# Patient Record
Sex: Male | Born: 1935 | Race: White | Hispanic: No | Smoking: Never smoker
Health system: Southern US, Community
[De-identification: ages and names within clinical notes are randomized; demographics above are authoritative.]

## PROBLEM LIST (undated history)

## (undated) DIAGNOSIS — R0689 Other abnormalities of breathing: Secondary | ICD-10-CM

## (undated) DIAGNOSIS — K219 Gastro-esophageal reflux disease without esophagitis: Secondary | ICD-10-CM

## (undated) DIAGNOSIS — R319 Hematuria, unspecified: Secondary | ICD-10-CM

## (undated) DIAGNOSIS — I10 Essential (primary) hypertension: Secondary | ICD-10-CM

## (undated) DIAGNOSIS — E871 Hypo-osmolality and hyponatremia: Secondary | ICD-10-CM

## (undated) DIAGNOSIS — E875 Hyperkalemia: Secondary | ICD-10-CM

## (undated) DIAGNOSIS — J9601 Acute respiratory failure with hypoxia: Secondary | ICD-10-CM

## (undated) DIAGNOSIS — J441 Chronic obstructive pulmonary disease with (acute) exacerbation: Secondary | ICD-10-CM

## (undated) DIAGNOSIS — Z950 Presence of cardiac pacemaker: Secondary | ICD-10-CM

## (undated) HISTORY — DX: Other abnormalities of breathing: R06.89

## (undated) HISTORY — DX: Hematuria, unspecified: R31.9

## (undated) HISTORY — DX: Chronic obstructive pulmonary disease with (acute) exacerbation: J44.1

## (undated) HISTORY — PX: PACEMAKER INSERTION: SHX728

## (undated) HISTORY — DX: Acute respiratory failure with hypoxia: J96.01

## (undated) HISTORY — DX: Essential (primary) hypertension: I10

## (undated) HISTORY — DX: Hyperkalemia: E87.5

## (undated) HISTORY — DX: Hypo-osmolality and hyponatremia: E87.1

---

## 2010-05-30 DIAGNOSIS — Z950 Presence of cardiac pacemaker: Secondary | ICD-10-CM | POA: Insufficient documentation

## 2010-05-30 DIAGNOSIS — I495 Sick sinus syndrome: Secondary | ICD-10-CM | POA: Insufficient documentation

## 2011-02-18 DIAGNOSIS — K219 Gastro-esophageal reflux disease without esophagitis: Secondary | ICD-10-CM | POA: Insufficient documentation

## 2011-03-27 DIAGNOSIS — G629 Polyneuropathy, unspecified: Secondary | ICD-10-CM | POA: Insufficient documentation

## 2011-09-25 DIAGNOSIS — I482 Chronic atrial fibrillation, unspecified: Secondary | ICD-10-CM | POA: Insufficient documentation

## 2012-05-04 DIAGNOSIS — J40 Bronchitis, not specified as acute or chronic: Secondary | ICD-10-CM | POA: Insufficient documentation

## 2012-06-16 DIAGNOSIS — D689 Coagulation defect, unspecified: Secondary | ICD-10-CM | POA: Insufficient documentation

## 2012-06-16 DIAGNOSIS — Z7901 Long term (current) use of anticoagulants: Secondary | ICD-10-CM | POA: Insufficient documentation

## 2012-07-20 DIAGNOSIS — R269 Unspecified abnormalities of gait and mobility: Secondary | ICD-10-CM | POA: Insufficient documentation

## 2012-07-20 DIAGNOSIS — R296 Repeated falls: Secondary | ICD-10-CM | POA: Insufficient documentation

## 2012-10-07 ENCOUNTER — Emergency Department (HOSPITAL_COMMUNITY): Payer: Medicare Other

## 2012-10-07 ENCOUNTER — Encounter (HOSPITAL_COMMUNITY): Payer: Self-pay | Admitting: Nurse Practitioner

## 2012-10-07 ENCOUNTER — Inpatient Hospital Stay (HOSPITAL_COMMUNITY)
Admission: EM | Admit: 2012-10-07 | Discharge: 2012-10-12 | DRG: 189 | Disposition: A | Discharge status: Home / Self Care | Payer: Medicare Other | Attending: Internal Medicine | Admitting: Internal Medicine

## 2012-10-07 DIAGNOSIS — J9601 Acute respiratory failure with hypoxia: Secondary | ICD-10-CM | POA: Diagnosis present

## 2012-10-07 DIAGNOSIS — Z7901 Long term (current) use of anticoagulants: Secondary | ICD-10-CM

## 2012-10-07 DIAGNOSIS — J441 Chronic obstructive pulmonary disease with (acute) exacerbation: Secondary | ICD-10-CM | POA: Diagnosis present

## 2012-10-07 DIAGNOSIS — K219 Gastro-esophageal reflux disease without esophagitis: Secondary | ICD-10-CM | POA: Diagnosis present

## 2012-10-07 DIAGNOSIS — I5022 Chronic systolic (congestive) heart failure: Secondary | ICD-10-CM | POA: Diagnosis present

## 2012-10-07 DIAGNOSIS — IMO0002 Reserved for concepts with insufficient information to code with codable children: Secondary | ICD-10-CM

## 2012-10-07 DIAGNOSIS — Z23 Encounter for immunization: Secondary | ICD-10-CM

## 2012-10-07 DIAGNOSIS — E875 Hyperkalemia: Secondary | ICD-10-CM | POA: Diagnosis present

## 2012-10-07 DIAGNOSIS — R319 Hematuria, unspecified: Secondary | ICD-10-CM | POA: Diagnosis present

## 2012-10-07 DIAGNOSIS — Z79899 Other long term (current) drug therapy: Secondary | ICD-10-CM

## 2012-10-07 DIAGNOSIS — M6530 Trigger finger, unspecified finger: Secondary | ICD-10-CM | POA: Diagnosis present

## 2012-10-07 DIAGNOSIS — Z8701 Personal history of pneumonia (recurrent): Secondary | ICD-10-CM

## 2012-10-07 DIAGNOSIS — I509 Heart failure, unspecified: Secondary | ICD-10-CM | POA: Diagnosis present

## 2012-10-07 DIAGNOSIS — R0902 Hypoxemia: Secondary | ICD-10-CM

## 2012-10-07 DIAGNOSIS — E871 Hypo-osmolality and hyponatremia: Secondary | ICD-10-CM | POA: Diagnosis present

## 2012-10-07 DIAGNOSIS — J9600 Acute respiratory failure, unspecified whether with hypoxia or hypercapnia: Principal | ICD-10-CM | POA: Diagnosis present

## 2012-10-07 DIAGNOSIS — I10 Essential (primary) hypertension: Secondary | ICD-10-CM | POA: Diagnosis present

## 2012-10-07 DIAGNOSIS — I4891 Unspecified atrial fibrillation: Secondary | ICD-10-CM | POA: Diagnosis present

## 2012-10-07 DIAGNOSIS — G9340 Encephalopathy, unspecified: Secondary | ICD-10-CM | POA: Diagnosis present

## 2012-10-07 DIAGNOSIS — Z950 Presence of cardiac pacemaker: Secondary | ICD-10-CM | POA: Diagnosis present

## 2012-10-07 DIAGNOSIS — R0689 Other abnormalities of breathing: Secondary | ICD-10-CM

## 2012-10-07 HISTORY — DX: Gastro-esophageal reflux disease without esophagitis: K21.9

## 2012-10-07 HISTORY — DX: Presence of cardiac pacemaker: Z95.0

## 2012-10-07 HISTORY — DX: Chronic obstructive pulmonary disease with (acute) exacerbation: J44.1

## 2012-10-07 HISTORY — DX: Acute respiratory failure with hypoxia: J96.01

## 2012-10-07 HISTORY — DX: Essential (primary) hypertension: I10

## 2012-10-07 LAB — CBC WITH DIFFERENTIAL/PLATELET
Basophils Absolute: 0.1 10*3/uL (ref 0.0–0.1)
Eosinophils Absolute: 0.3 10*3/uL (ref 0.0–0.7)
Eosinophils Relative: 3 % (ref 0–5)
Lymphocytes Relative: 16 % (ref 12–46)
MCH: 29.4 pg (ref 26.0–34.0)
MCHC: 31.9 g/dL (ref 30.0–36.0)
MCV: 92.2 fL (ref 78.0–100.0)
Neutrophils Relative %: 67 % (ref 43–77)
Platelets: 244 10*3/uL (ref 150–400)
RBC: 3.6 MIL/uL — ABNORMAL LOW (ref 4.22–5.81)
RDW: 16.6 % — ABNORMAL HIGH (ref 11.5–15.5)
WBC: 11.1 10*3/uL — ABNORMAL HIGH (ref 4.0–10.5)

## 2012-10-07 LAB — POCT I-STAT 3, ART BLOOD GAS (G3+)
Acid-Base Excess: 4 mmol/L — ABNORMAL HIGH (ref 0.0–2.0)
O2 Saturation: 95 %
Patient temperature: 98.6
pCO2 arterial: 78.9 mmHg (ref 35.0–45.0)

## 2012-10-07 LAB — URINE MICROSCOPIC-ADD ON

## 2012-10-07 LAB — URINALYSIS, ROUTINE W REFLEX MICROSCOPIC
Nitrite: NEGATIVE
Protein, ur: 30 mg/dL — AB
Specific Gravity, Urine: 1.018 (ref 1.005–1.030)
Urobilinogen, UA: 0.2 mg/dL (ref 0.0–1.0)

## 2012-10-07 LAB — BASIC METABOLIC PANEL
CO2: 26 mEq/L (ref 19–32)
Calcium: 8.9 mg/dL (ref 8.4–10.5)
GFR calc non Af Amer: 73 mL/min — ABNORMAL LOW (ref >90)
Potassium: 5.2 mEq/L — ABNORMAL HIGH (ref 3.5–5.1)
Sodium: 133 mEq/L — ABNORMAL LOW (ref 135–145)

## 2012-10-07 LAB — POCT I-STAT TROPONIN I: Troponin i, poc: 0.04 ng/mL (ref 0.00–0.08)

## 2012-10-07 MED ORDER — LEVOFLOXACIN 750 MG PO TABS
750.0000 mg | ORAL_TABLET | Freq: Every day | ORAL | Status: DC
  Administered 2012-10-08: 750 mg via ORAL
  Filled 2012-10-07 (×2): qty 1

## 2012-10-07 MED ORDER — SODIUM CHLORIDE 0.9 % IJ SOLN
3.0000 mL | Freq: Two times a day (BID) | INTRAMUSCULAR | Status: DC
  Administered 2012-10-08 – 2012-10-11 (×5): 3 mL via INTRAVENOUS

## 2012-10-07 MED ORDER — ALBUTEROL SULFATE (5 MG/ML) 0.5% IN NEBU
5.0000 mg | INHALATION_SOLUTION | Freq: Once | RESPIRATORY_TRACT | Status: AC
  Administered 2012-10-07: 5 mg via RESPIRATORY_TRACT
  Filled 2012-10-07: qty 1

## 2012-10-07 MED ORDER — DABIGATRAN ETEXILATE MESYLATE 150 MG PO CAPS
150.0000 mg | ORAL_CAPSULE | Freq: Two times a day (BID) | ORAL | Status: DC
  Administered 2012-10-08 – 2012-10-12 (×10): 150 mg via ORAL
  Filled 2012-10-07 (×11): qty 1

## 2012-10-07 MED ORDER — FUROSEMIDE 20 MG PO TABS
20.0000 mg | ORAL_TABLET | Freq: Every day | ORAL | Status: DC
  Filled 2012-10-07: qty 1

## 2012-10-07 MED ORDER — ALBUTEROL SULFATE (5 MG/ML) 0.5% IN NEBU: 2.5000 mg | INHALATION_SOLUTION | RESPIRATORY_TRACT | Status: DC | PRN

## 2012-10-07 MED ORDER — METOPROLOL TARTRATE 25 MG PO TABS: 25.0000 mg | ORAL_TABLET | Freq: Two times a day (BID) | ORAL | Status: DC

## 2012-10-07 MED ORDER — LEVOTHYROXINE SODIUM 200 MCG PO TABS
200.0000 ug | ORAL_TABLET | Freq: Every day | ORAL | Status: DC
  Administered 2012-10-08 – 2012-10-12 (×5): 200 ug via ORAL
  Filled 2012-10-07 (×6): qty 1

## 2012-10-07 MED ORDER — PANTOPRAZOLE SODIUM 20 MG PO TBEC
20.0000 mg | DELAYED_RELEASE_TABLET | Freq: Every day | ORAL | Status: DC
  Administered 2012-10-08 – 2012-10-12 (×5): 20 mg via ORAL
  Filled 2012-10-07 (×5): qty 1

## 2012-10-07 MED ORDER — PREDNISONE 1 MG PO TABS
1.0000 mg | ORAL_TABLET | Freq: Every day | ORAL | Status: DC
  Administered 2012-10-08 – 2012-10-09 (×2): 1 mg via ORAL
  Filled 2012-10-07 (×3): qty 1

## 2012-10-07 MED ORDER — SIMVASTATIN 10 MG PO TABS
10.0000 mg | ORAL_TABLET | Freq: Every day | ORAL | Status: DC
  Filled 2012-10-07 (×2): qty 1

## 2012-10-07 MED ORDER — IPRATROPIUM BROMIDE 0.02 % IN SOLN
0.5000 mg | Freq: Once | RESPIRATORY_TRACT | Status: AC
  Administered 2012-10-07: 0.5 mg via RESPIRATORY_TRACT
  Filled 2012-10-07: qty 2.5

## 2012-10-07 MED ORDER — FUROSEMIDE 10 MG/ML IJ SOLN
40.0000 mg | Freq: Once | INTRAMUSCULAR | Status: AC
  Administered 2012-10-07: 40 mg via INTRAVENOUS
  Filled 2012-10-07: qty 4

## 2012-10-07 MED ORDER — METOPROLOL SUCCINATE ER 25 MG PO TB24
25.0000 mg | ORAL_TABLET | Freq: Two times a day (BID) | ORAL | Status: DC
  Administered 2012-10-08: 25 mg via ORAL
  Filled 2012-10-07 (×3): qty 1

## 2012-10-07 MED ORDER — ROPINIROLE HCL 0.5 MG PO TABS
0.5000 mg | ORAL_TABLET | Freq: Every day | ORAL | Status: DC
  Administered 2012-10-08: 0.5 mg via ORAL
  Filled 2012-10-07 (×3): qty 1

## 2012-10-07 MED ORDER — ALBUTEROL SULFATE (5 MG/ML) 0.5% IN NEBU
2.5000 mg | INHALATION_SOLUTION | Freq: Four times a day (QID) | RESPIRATORY_TRACT | Status: DC
  Administered 2012-10-08 (×2): 2.5 mg via RESPIRATORY_TRACT
  Filled 2012-10-07 (×3): qty 0.5

## 2012-10-07 NOTE — ED Provider Notes (Signed)
CSN: [id number]     Arrival date & time 10/07/12  1550 History   First MD Initiated Contact with Patient 10/07/12 1550     Chief Complaint  Patient presents with  . hypoxia    (Consider location/radiation/quality/duration/timing/severity/associated sxs/prior Treatment) HPI Comments: Patient is a 79 year old man with history of recent pneumonia, pacemaker presents today sent in from his primary care physician for hypoxia. He was at [facility] physicians for his 3 day checkup on a right lower lobe pneumonia. He was treated with Levaquin. He lives at home with his wife. His wife stopped the Levaquin because she felt like his mental status was worsening and he began slurring his speech. Later in the visit the wife noted it was not slurred speech and it was more excess mucous in the patient's mouth. Reports altered mental status is only present while sleeping. The wife reports chills, but no fever. This information was all collected from the physician note at [facility] physicians. No family members are in the room at this time. Patient reports that he "just doesn't feel well" he is oriented to person. He believes he is at [facility], but he knows he is in [place]. He gives the month of September.  The history is provided by the patient and medical records. No language interpreter was used.    Past Medical History  Diagnosis Date  . GERD (gastroesophageal reflux disease)   . Pacemaker medtronic single chamber   Past Surgical History  Procedure Laterality Date  . Pacemaker insertion     No family history on file. History  Substance Use Topics  . Smoking status: Never Smoker   . Smokeless tobacco: Not on file  . Alcohol Use: No    Review of Systems  Constitutional: Positive for chills. Negative for fever.  Respiratory: Positive for cough and shortness of breath.   Cardiovascular: Negative for chest pain.  Gastrointestinal: Negative for nausea, vomiting and abdominal pain.  All other  systems reviewed and are negative.    Allergies  Review of patient's allergies indicates not on file.  Home Medications   Current Outpatient Rx  Name  Route  Sig  Dispense  Refill  . dabigatran (PRADAXA) 150 MG CAPS capsule   Oral   Take 150 mg by mouth 2 (two) times daily.         . furosemide (LASIX) 20 MG tablet   Oral   Take 20 mg by mouth daily.         . lansoprazole (PREVACID) 30 MG capsule   Oral   Take 30 mg by mouth daily.         Marland Kitchen levothyroxine (SYNTHROID, LEVOTHROID) 200 MCG tablet   Oral   Take 200 mcg by mouth daily before breakfast.         . metoprolol succinate (TOPROL-XL) 25 MG 24 hr tablet   Oral   Take 25 mg by mouth 2 (two) times daily.         . predniSONE (DELTASONE) 1 MG tablet   Oral   Take 1 mg by mouth daily.         Marland Kitchen rOPINIRole (REQUIP) 0.5 MG tablet   Oral   Take 0.5 mg by mouth at bedtime.          BP 154/72  Pulse 46  Temp(Src) 97.7 F (36.5 C) (Oral)  Resp 18  SpO2 94% Physical Exam  Nursing note and vitals reviewed. Constitutional: He is oriented to person, place, and time. He  appears well-developed and well-nourished. No distress.  HENT:  Head: Normocephalic and atraumatic.  Right Ear: External ear normal.  Left Ear: External ear normal.  Nose: Nose normal.  Eyes: Conjunctivae are normal.  Neck: Normal range of motion. No tracheal deviation present.  Cardiovascular: Normal rate, regular rhythm and normal heart sounds.   Pulmonary/Chest: No stridor. Tachypnea noted. He has decreased breath sounds in the right lower field and the left lower field. He has wheezes in the right upper field, the right middle field, the left upper field and the left middle field.  Junky, dry cough heard during exam  Abdominal: Soft. He exhibits no distension. There is no tenderness.  Musculoskeletal: Normal range of motion.  Neurological: He is alert and oriented to person, place, and time. He has normal strength.  Follows all  commands. No focal deficits  Skin: Skin is warm and dry. He is not diaphoretic.  Psychiatric: He has a normal mood and affect. His behavior is normal.    ED Course  Procedures (including critical care time) Labs Review Labs Reviewed  MRSA PCR SCREENING - Abnormal; Notable for the following:    MRSA by PCR POSITIVE (*)    All other components within normal limits  CBC WITH DIFFERENTIAL - Abnormal; Notable for the following:    WBC 11.1 (*)    RBC 3.60 (*)    Hemoglobin 10.6 (*)    HCT 33.2 (*)    RDW 16.6 (*)    Monocytes Relative 14 (*)    Monocytes Absolute 1.6 (*)    All other components within normal limits  BASIC METABOLIC PANEL - Abnormal; Notable for the following:    Sodium 133 (*)    Potassium 5.2 (*)    GFR calc non Af Amer 73 (*)    GFR calc Af Amer 84 (*)    All other components within normal limits  URINALYSIS, ROUTINE W REFLEX MICROSCOPIC - Abnormal; Notable for the following:    Color, Urine RED (*)    APPearance CLOUDY (*)    Hgb urine dipstick LARGE (*)    Protein, ur 30 (*)    Leukocytes, UA SMALL (*)    All other components within normal limits  POCT I-STAT 3, BLOOD GAS (G3+) - Abnormal; Notable for the following:    pH, Arterial 7.235 (*)    pCO2 arterial 78.9 (*)    Bicarbonate 33.4 (*)    Acid-Base Excess 4.0 (*)    All other components within normal limits  CULTURE, BLOOD (ROUTINE X 2)  URINE CULTURE  URINE MICROSCOPIC-ADD ON  TROPONIN I  TROPONIN I  TROPONIN I  POCT I-STAT TROPONIN I  ABO/RH  TYPE AND SCREEN   Imaging Review Dg Chest 2 View  10/07/2012   CLINICAL DATA:  Shortness of breath  EXAM: CHEST  2 VIEW  COMPARISON:  10/04/2012  FINDINGS: Cardiac shadow remains enlarged. A pacing device is again seen. Mild vascular congestion is again noted. No focal confluent infiltrate is seen. Small effusions are noted. Findings of prior vertebral augmentation are seen. Chronic thoracic compression deformities are noted.  IMPRESSION: Mild vascular  congestion stable from the previous exam. No acute abnormality is noted.   Electronically Signed   By: Alcide Clever   On: 10/07/2012 16:59   Ct Head Wo Contrast  10/07/2012   *RADIOLOGY REPORT*  Clinical Data: Severe headache, altered mental status  CT HEAD WITHOUT CONTRAST  Technique:  Contiguous axial images were obtained from the base of the  skull through the vertex without contrast.  Comparison: None.  Findings: No acute intracranial hemorrhage, acute infarction, mass lesion, mass effect, midline shift or hydrocephalus.  Gray-white differentiation is preserved throughout.  Global cerebral and cerebellar atrophy with compensatory ex vacuo dilatation of the lateral ventricles.  Confluent periventricular and scattered deep and subcortical white matter hypoattenuation most consistent with the sequela of longstanding microvascular ischemia. Atherosclerotic calcifications noted in the bilateral cavernous carotid arteries.  No acute soft tissue or calvarial abnormality. Globes and orbits are intact and unremarkable bilaterally.  Normal aeration of the mastoid air cells and paranasal sinuses.  IMPRESSION:  1.  No acute intracranial abnormality. 2.  Atrophy and advanced microvascular ischemic white matter disease.   Original Report Authenticated By: Malachy Moan, M.D.    MDM   1. Hypoxemia   2. COPD exacerbation   3. Pacemaker    Patient admitted for hypoxemia, copd exacerbation. ABG shows acute on chronic disease. Head CT shows no acute abnormality.  Pt placed on a 45% venti mask. Discussed case with Dr. Conley Rolls after he evaluated patient. He agrees patient is stable enough to admit. Admission appreciated. Dr. Fayrene Fearing evaluated patient and agrees with plan.     Mora Bellman, PA-C 10/08/12 1413

## 2012-10-07 NOTE — ED Notes (Addendum)
Per family last week pt had productive cough and congestion pt got rx. For  musinex and delsym.- did not relieve. Went to MD again got rx for abx, prednisone pt took OTC sleeping pills as well and started having hallucinations. Tuesday pt went for check up and got chest X-ray suggestive of pneumonia- given Levaquin. Family sts since levaquin pt has been increasingly confused and altered. sts lethargic and sleeping throughout the day and insomnia at night.   At present- pt sleeping mouth open snoring, pt arousable by loud verbal stimuli.

## 2012-10-07 NOTE — ED Notes (Signed)
RN explains procedure of catheterization to patient. Pt becoming more alert- begins to urinate during catheter insertion. RN inserted catheter using sterile technique. Pt now alert and oriented, speaking full sentences. GCS 15. Dahlia Client PA and Dr. Fayrene Fearing made aware.

## 2012-10-07 NOTE — ED Notes (Signed)
Phlebotomy at bedside.

## 2012-10-07 NOTE — ED Notes (Signed)
Phlebotomy called- blood had not been drawn. Attempted x 2 by this RN with IV access- blood would not draw back.

## 2012-10-07 NOTE — ED Notes (Signed)
Respiratory called for bipap.

## 2012-10-07 NOTE — ED Notes (Signed)
Per EMS pt picked up from [facility] physicians for low SpO2 reading- 84%/RA and when seen on Monday was 94%/RA. Extremities cool. Pt SpO2 fluctuated from 88-98 for EMS. Pt has pacemaker- with demand pacemaker rhythm. Denies ShOB, pt appears lethargic and family stated pt has been seeming more lethargic and mildly confused recently. Pt dx with pneumonia x 2days and has been taking abx as rx.

## 2012-10-07 NOTE — ED Notes (Signed)
Phlebotomist sts got one set of blood cultures and I-stat. Will have other phlebotomist attempt. PA made aware

## 2012-10-07 NOTE — H&P (Addendum)
[facility] History and Physical  [name] ZOX:[id number] DOB: 12-29-1920    PCP:  Deboraha Sprang Physician.  Chief Complaint:  Hypoxia.  HPI: Christopher Buchanan is an 79 y.o. male with hx of COPD, recent PNA on Levoquin, s/p PPM, afib on betablocker and Pradaxa, brought to the ER as he was having intermittent lethargy and confusion.  PCP also noted he was hypoxic.  He still has some nonproductive coughs, but no chest pain.  There has been no fever or chills.  In the ER, he was found to have a CXR with only mild vascular congestion. His head CT showed no acute processes.  In the ER, as supplemental oxygen in the form of nasal canula was given to him, he became even more lethargic,  ABG 7.26/79/paO2=94. When I then saw him, he was alert and was able to converse meaningfully.  He denied being short of breath or having any chest pain.  His WBC was 11K, and his renal fx tests were normal with K of 5.2 mEQ per Liter.  Hospitalist was asked to admit him for these symptoms.  When he was in the ER, foley catheter was placed, and there were hematuria presumably from insertion trauma.  Rewiew of Systems:  Constitutional: Negative for malaise, fever and chills. No significant weight loss or weight gain Eyes: Negative for eye pain, redness and discharge, diplopia, visual changes, or flashes of light. ENMT: Negative for ear pain, hoarseness, nasal congestion, sinus pressure and sore throat. No headaches; tinnitus, drooling, or problem swallowing. Cardiovascular: Negative for chest pain, palpitations, diaphoresis, dyspnea and peripheral edema. ; No orthopnea, PND Respiratory: Negative for hemoptysis.  No pleuritic chestpain. Gastrointestinal: Negative for nausea, vomiting, diarrhea, constipation, abdominal pain, melena, blood in stool, hematemesis, jaundice and rectal bleeding.    Genitourinary: Negative for frequency, dysuria, incontinence,flank pain and hematuria; Musculoskeletal: Negative for back pain and neck  pain. Negative for swelling and trauma.;  Skin: . Negative for pruritus, rash, abrasions, bruising and skin lesion.; ulcerations Neuro: Negative for headache, lightheadedness and neck stiffness. Negative for weakness, altered level of consciousness , extremity weakness, burning feet, involuntary movement, seizure and syncope.  Psych: negative for anxiety, depression, insomnia, tearfulness, panic attacks, hallucinations, paranoia, suicidal or homicidal ideation   Past Medical History  Diagnosis Date  . GERD (gastroesophageal reflux disease)   . Pacemaker medtronic single chamber    Past Surgical History  Procedure Laterality Date  . Pacemaker insertion      Medications:  HOME MEDS: Prior to Admission medications   Medication Sig Start Date End Date Taking? Authorizing Provider  azithromycin (ZITHROMAX) 250 MG tablet  10/02/12   Historical Provider, MD  dabigatran (PRADAXA) 150 MG CAPS capsule Take 150 mg by mouth 2 (two) times daily.   Yes Historical Provider, MD  furosemide (LASIX) 20 MG tablet Take 20 mg by mouth daily.   Yes Historical Provider, MD  lansoprazole (PREVACID) 30 MG capsule Take 30 mg by mouth daily.   Yes Historical Provider, MD  levofloxacin (LEVAQUIN) 500 MG tablet  10/04/12   Historical Provider, MD  levothyroxine (SYNTHROID, LEVOTHROID) 200 MCG tablet Take 200 mcg by mouth daily before breakfast.   Yes Historical Provider, MD  metoprolol succinate (TOPROL-XL) 25 MG 24 hr tablet Take 25 mg by mouth 2 (two) times daily.   Yes Historical Provider, MD  metoprolol tartrate (LOPRESSOR) 25 MG tablet  09/13/12   Historical Provider, MD  pravastatin (PRAVACHOL) 20 MG tablet  08/16/12   Historical Provider, MD  predniSONE (DELTASONE)  1 MG tablet Take 1 mg by mouth daily.   Yes Historical Provider, MD  rOPINIRole (REQUIP) 0.5 MG tablet Take 0.5 mg by mouth at bedtime.   Yes Historical Provider, MD     Allergies:  Not on File  Social History:   reports that he has never  smoked. He does not have any smokeless tobacco history on file. He reports that he does not drink alcohol or use illicit drugs.  Family History: No family history on file.   Physical Exam: Filed Vitals:   10/07/12 2045 10/07/12 2115 10/07/12 2145 10/07/12 2200  BP: 128/111 140/97 117/100 134/66  Pulse:  72 68 62  Temp:      TempSrc:      Resp: 15 21 20 11   SpO2: 99% 100% 97% 100%   Blood pressure 134/66, pulse 62, temperature 97.6 F (36.4 C), temperature source Rectal, resp. rate 11, SpO2 100.00%.  GEN:  Pleasant  patient lying in the stretcher in no acute distress; cooperative with exam. PSYCH:  alert and oriented x4; does not appear anxious or depressed; affect is appropriate. HEENT: Mucous membranes pink and anicteric; PERRLA; EOM intact; no cervical lymphadenopathy nor thyromegaly or carotid bruit; no JVD; There were no stridor. Neck is very supple. Breasts:: Not examined CHEST WALL: No tenderness CHEST: Normal respiration, mild wheezing with bibisilar rales. HEART: Regular rate and rhythm.  There are no murmur, rub, or gallops.   BACK: No kyphosis or scoliosis; no CVA tenderness ABDOMEN: soft and non-tender; no masses, no organomegaly, normal abdominal bowel sounds; no pannus; no intertriginous candida. There is no rebound and no distention. Rectal Exam: Not done EXTREMITIES: No bone or joint deformity; age-appropriate arthropathy of the hands and knees; no edema; no ulcerations.  There is no calf tenderness. Genitalia: not examined PULSES: 2+ and symmetric SKIN: Normal hydration no rash or ulceration CNS: Cranial nerves 2-12 grossly intact no focal lateralizing neurologic deficit.  Speech is fluent; uvula elevated with phonation, facial symmetry and tongue midline. DTR are normal bilaterally, cerebella exam is intact, barbinski is negative and strengths are equaled bilaterally.  No sensory loss.   Labs on Admission:  Basic Metabolic Panel:  Recent Labs Lab 10/07/12 1847   NA 133*  K 5.2*  CL 97  CO2 26  GLUCOSE 99  BUN 21  CREATININE 0.90  CALCIUM 8.9   Liver Function Tests: No results found for this basename: AST, ALT, ALKPHOS, BILITOT, PROT, ALBUMIN,  in the last 168 hours No results found for this basename: LIPASE, AMYLASE,  in the last 168 hours No results found for this basename: AMMONIA,  in the last 168 hours CBC:  Recent Labs Lab 10/07/12 1612  WBC 11.1*  NEUTROABS 7.5  HGB 10.6*  HCT 33.2*  MCV 92.2  PLT 244   Cardiac Enzymes: No results found for this basename: CKTOTAL, CKMB, CKMBINDEX, TROPONINI,  in the last 168 hours  CBG: No results found for this basename: GLUCAP,  in the last 168 hours   Radiological Exams on Admission: Dg Chest 2 View  10/07/2012   CLINICAL DATA:  Shortness of breath  EXAM: CHEST  2 VIEW  COMPARISON:  10/04/2012  FINDINGS: Cardiac shadow remains enlarged. A pacing device is again seen. Mild vascular congestion is again noted. No focal confluent infiltrate is seen. Small effusions are noted. Findings of prior vertebral augmentation are seen. Chronic thoracic compression deformities are noted.  IMPRESSION: Mild vascular congestion stable from the previous exam. No acute abnormality is noted.  Electronically Signed   By: Alcide Clever   On: 10/07/2012 16:59   Ct Head Wo Contrast  10/07/2012   *RADIOLOGY REPORT*  Clinical Data: Severe headache, altered mental status  CT HEAD WITHOUT CONTRAST  Technique:  Contiguous axial images were obtained from the base of the skull through the vertex without contrast.  Comparison: None.  Findings: No acute intracranial hemorrhage, acute infarction, mass lesion, mass effect, midline shift or hydrocephalus.  Gray-white differentiation is preserved throughout.  Global cerebral and cerebellar atrophy with compensatory ex vacuo dilatation of the lateral ventricles.  Confluent periventricular and scattered deep and subcortical white matter hypoattenuation most consistent with the  sequela of longstanding microvascular ischemia. Atherosclerotic calcifications noted in the bilateral cavernous carotid arteries.  No acute soft tissue or calvarial abnormality. Globes and orbits are intact and unremarkable bilaterally.  Normal aeration of the mastoid air cells and paranasal sinuses.  IMPRESSION:  1.  No acute intracranial abnormality. 2.  Atrophy and advanced microvascular ischemic white matter disease.   Original Report Authenticated By: Malachy Moan, M.D.    Assessment/Plan Present on Admission:  . Hypoxemia . COPD exacerbation . HTN (hypertension) . Pacemaker  PLAN:  I think this gentleman has both COPD exacerbation and a little bit of CHF.  He is also a retainer, and became very lethargic with nasal canula.  Will give him 40 percent venturi mask.  I will continue his Levaquin, and diurese him a bit.  Will continue his home meds, including Pradaxa for his afib.  He did have some trauma with the foley placement, and will follow.  I will continue Pradaxa.  He is stable, but because of his tendency to hypoventilate and becoming co2 narcosis, will put him in the SDU.  Thank you for allowing me to participate in his care.  Other plans as per orders.  Code Status: FULL Unk Lightning, MD. [facility] Pager [phone] 7pm to 7am.  10/07/2012, 10:31 PM

## 2012-10-07 NOTE — ED Notes (Signed)
Christopher Buchanan (wife)  (home) [phone]

## 2012-10-07 NOTE — ED Notes (Signed)
Rn transported pt to CT- pt follows commands when speaking loudly.

## 2012-10-08 DIAGNOSIS — R0689 Other abnormalities of breathing: Secondary | ICD-10-CM | POA: Diagnosis present

## 2012-10-08 DIAGNOSIS — R319 Hematuria, unspecified: Secondary | ICD-10-CM

## 2012-10-08 DIAGNOSIS — E871 Hypo-osmolality and hyponatremia: Secondary | ICD-10-CM | POA: Diagnosis present

## 2012-10-08 DIAGNOSIS — E875 Hyperkalemia: Secondary | ICD-10-CM

## 2012-10-08 DIAGNOSIS — I369 Nonrheumatic tricuspid valve disorder, unspecified: Secondary | ICD-10-CM

## 2012-10-08 HISTORY — DX: Hypo-osmolality and hyponatremia: E87.1

## 2012-10-08 HISTORY — DX: Hematuria, unspecified: R31.9

## 2012-10-08 HISTORY — DX: Other abnormalities of breathing: R06.89

## 2012-10-08 HISTORY — DX: Hyperkalemia: E87.5

## 2012-10-08 LAB — TROPONIN I
Troponin I: <0.3 ng/mL (ref <0.30)
Troponin I: <0.3 ng/mL (ref <0.30)

## 2012-10-08 LAB — MRSA PCR SCREENING: MRSA by PCR: POSITIVE — AB

## 2012-10-08 LAB — TYPE AND SCREEN: ABO/RH(D): A POS

## 2012-10-08 MED ORDER — SODIUM CHLORIDE 0.9 % IV SOLN
INTRAVENOUS | Status: DC
  Administered 2012-10-08: 50 mL/h via INTRAVENOUS

## 2012-10-08 MED ORDER — BIOTENE DRY MOUTH MT LIQD
15.0000 mL | Freq: Two times a day (BID) | OROMUCOSAL | Status: DC
  Administered 2012-10-08 – 2012-10-12 (×7): 15 mL via OROMUCOSAL

## 2012-10-08 MED ORDER — MUPIROCIN 2 % EX OINT
1.0000 "application " | TOPICAL_OINTMENT | Freq: Two times a day (BID) | CUTANEOUS | Status: DC
  Administered 2012-10-08 – 2012-10-12 (×8): 1 via NASAL
  Filled 2012-10-08 (×2): qty 22

## 2012-10-08 MED ORDER — IPRATROPIUM BROMIDE 0.02 % IN SOLN
0.5000 mg | Freq: Four times a day (QID) | RESPIRATORY_TRACT | Status: DC
  Administered 2012-10-08 – 2012-10-09 (×2): 0.5 mg via RESPIRATORY_TRACT
  Filled 2012-10-08 (×2): qty 2.5

## 2012-10-08 MED ORDER — ALBUTEROL SULFATE (5 MG/ML) 0.5% IN NEBU
2.5000 mg | INHALATION_SOLUTION | Freq: Four times a day (QID) | RESPIRATORY_TRACT | Status: DC
  Administered 2012-10-08 – 2012-10-09 (×2): 2.5 mg via RESPIRATORY_TRACT
  Filled 2012-10-08 (×2): qty 0.5

## 2012-10-08 MED ORDER — CHLORHEXIDINE GLUCONATE CLOTH 2 % EX PADS
6.0000 | MEDICATED_PAD | Freq: Every day | CUTANEOUS | Status: AC
  Administered 2012-10-08 – 2012-10-12 (×5): 6 via TOPICAL

## 2012-10-08 NOTE — Progress Notes (Signed)
Utilization review completed. Santana Edell, RN, BSN. 

## 2012-10-08 NOTE — Progress Notes (Signed)
  Echocardiogram 2D Echocardiogram has been performed.  Arvil Chaco 10/08/2012, 10:17 AM

## 2012-10-08 NOTE — Progress Notes (Signed)
[facility] Progress Note [place] TEAM 1 - Stepdown ICU Team   Stephens Shreve NUU:[id number] DOB: 12-Feb-1920 DOA: 10/07/2012 PCP: No primary provider on file.  Brief narrative: 79 year old male patient with known history of COPD recently treated with Levaquin for pneumonia. He also has atrial fibrillation and is on chronic Pradaxa. He was sent to the ER for intermittent episodes of lethargy and confusion. He was initially sent to his primary care physician's office where he was noted to be hypoxic. He'd been having nonproductive cough but has not been having any type of chest discomfort. No constitutional symptoms. Chest x-ray in the ER revealed only mild vascular congestion. CT of the head was negative for acute changes. In the ER he received supplemental oxygen via nasal cannula but became more lethargic at which point an ABG was obtained with a pH of 7.26 and a PCO2 of 79 PO2 94. By the time the admitting physician came to evaluate the patient he was alert and able to converse meaningfully and was not having any respiratory symptoms. His white count was 11,000 - he did have mild hyponatremia and borderline hyperkalemia. A Foley catheter was inserted in the emergency department and subsequently patient was noted with hematuria.  Assessment/Plan:  Acute respiratory failure with hypoxia and hypercapnia due to COPD exacerbation -stable on [place] oxygen but seems somewhat confused -follow closely -cont Levaquin to cover for PNA/bronchitis  Chronic RHF -ECHO this admit is moderately dilated with mild to moderate systolic dysfunction - currently appears compensated -since appears dehydrated with soft BP suspect needs more preload given RV dilatation so will continue IVFs  Hyperkalemia / Hyponatremia / DH -likely due to [facility] - repeat lytes now -BP soft so hold Toprol and Lasix  Hematuria -suspect due to foley trauma - UA somewhat abnormal so will check urine cx  Atrial fibrillation -rate  controlled  -cont Pradaxa  HTN (hypertension) -BP soft  S/P Pacemaker  DVT prophylaxis: Pradaxa Code Status: Full Family Communication: Patient only - no family in room and time of examination Disposition Plan/Expected LOS: Stepdown Isolation: Contact isolation for MRSA PCR positive status  Consultants: None  Procedures: TTE - Left ventricle: The cavity size was normal. There was moderate concentric hypertrophy. Systolic function was normal. Wall motion was normal; there were no regional wall motion abnormalities. - Ventricular septum: Septal motion showed paradox. These changes are consistent with a left bundle branch block. - Left atrium: The atrium was moderately dilated. - Right ventricle: The cavity size was moderately dilated. Wall thickness was normal. Systolic function was mildly to moderately reduced. - Right atrium: The atrium was moderately to severely dilated. - Tricuspid valve: Moderate regurgitation. - Pulmonary arteries: PA peak pressure: 54mm Hg (S).  Antibiotics: Levaquin 10/2 >>>  HPI/Subjective: Patient awake and complaining of pain in one of his left fingers. Unable to clearly describe the symptoms but then was able to demonstrate findings consistent with trigger finger. No chest pain or shortness of breath.  Objective: Blood pressure 100/68, pulse 61, temperature 98.9 F (37.2 C), temperature source Oral, resp. rate 23, SpO2 100.00%.  Intake/Output Summary (Last 24 hours) at 10/08/12 1342 Last data filed at 10/08/12 0826  Gross per 24 hour  Intake      0 ml  Output   3250 ml  Net  -3250 ml   Exam: General: No acute respiratory distress at rest  Lungs: Coarse to auscultation bilaterally without wheezes or crackles, 2L Cardiovascular: Regular rate and rhythm without murmur gallop or rub normal  S1 and S2, no peripheral edema or JVD Abdomen: Nontender, nondistended, soft, bowel sounds positive, no rebound, no ascites, no appreciable  mass Musculoskeletal: No significant cyanosis, clubbing of bilateral lower extremities Neurological: Alert and oriented x name only, moves all extremities x 4 without focal neurological deficits  Scheduled Meds:  Scheduled Meds: . albuterol  2.5 mg Nebulization Q6H  . antiseptic oral rinse  15 mL Mouth Rinse BID  . Chlorhexidine Gluconate Cloth  6 each Topical Q0600  . dabigatran  150 mg Oral BID  . levofloxacin  750 mg Oral Daily  . levothyroxine  200 mcg Oral QAC breakfast  . mupirocin ointment  1 application Nasal BID  . pantoprazole  20 mg Oral Daily  . predniSONE  1 mg Oral Q breakfast  . rOPINIRole  0.5 mg Oral QHS  . simvastatin  10 mg Oral q1800  . sodium chloride  3 mL Intravenous Q12H    Data Reviewed: Basic Metabolic Panel:  Recent Labs Lab 10/07/12 1847  NA 133*  K 5.2*  CL 97  CO2 26  GLUCOSE 99  BUN 21  CREATININE 0.90  CALCIUM 8.9   Liver Function Tests: No results found for this basename: AST, ALT, ALKPHOS, BILITOT, PROT, ALBUMIN,  in the last 168 hours  CBC:  Recent Labs Lab 10/07/12 1612  WBC 11.1*  NEUTROABS 7.5  HGB 10.6*  HCT 33.2*  MCV 92.2  PLT 244   Cardiac Enzymes:  Recent Labs Lab 10/07/12 2305 10/08/12 0500 10/08/12 1057  TROPONINI <0.30 <0.30 <0.30    Recent Results (from the past 240 hour(s))  MRSA PCR SCREENING     Status: Abnormal   Collection Time    10/07/12 11:26 PM      Result Value Range Status   MRSA by PCR POSITIVE (*) NEGATIVE Final   Comment:            The GeneXpert MRSA Assay (FDA     approved for NASAL specimens     only), is one component of a     comprehensive MRSA colonization     surveillance program. It is not     intended to diagnose MRSA     infection nor to guide or     monitor treatment for     MRSA infections.     RESULT CALLED TO, READ BACK BY AND VERIFIED WITH:     Rocco Pauls RN [phone] GREEN R    Studies:  Recent x-ray studies have been reviewed in detail by the Attending  Physician  Junious Silk, ANP [facility] Office  [phone] Pager [phone]  **If unable to reach the above provider after paging please contact the Flow Manager @ [phone]  On-Call/Text Page:      [email or web address]      password [email or web address]  If 7PM-7AM, please contact night-coverage [email or web address] Password [email or web address] 10/08/2012, 1:42 PM   LOS: 1 day   I have personally examined this patient and reviewed the entire database. I have reviewed the above note, made any necessary editorial changes, and agree with its content.  Lonia Blood, MD [facility]

## 2012-10-09 LAB — BASIC METABOLIC PANEL
CO2: 33 mEq/L — ABNORMAL HIGH (ref 19–32)
Calcium: 8.7 mg/dL (ref 8.4–10.5)
Chloride: 93 mEq/L — ABNORMAL LOW (ref 96–112)
Creatinine, Ser: 1.31 mg/dL (ref 0.50–1.35)
GFR calc Af Amer: 54 mL/min — ABNORMAL LOW (ref >90)
GFR calc non Af Amer: 46 mL/min — ABNORMAL LOW (ref >90)
Potassium: 4.2 mEq/L (ref 3.5–5.1)
Sodium: 132 mEq/L — ABNORMAL LOW (ref 135–145)

## 2012-10-09 LAB — POCT I-STAT 3, ART BLOOD GAS (G3+)
Bicarbonate: 37.4 mEq/L — ABNORMAL HIGH (ref 20.0–24.0)
O2 Saturation: 97 %
pCO2 arterial: 68.2 mmHg (ref 35.0–45.0)
pH, Arterial: 7.347 — ABNORMAL LOW (ref 7.350–7.450)
pO2, Arterial: 100 mmHg (ref 80.0–100.0)

## 2012-10-09 LAB — FOLATE: Folate: >20 ng/mL

## 2012-10-09 LAB — VITAMIN B12: Vitamin B-12: 1735 pg/mL — ABNORMAL HIGH (ref 211–911)

## 2012-10-09 LAB — CBC
MCV: 94.5 fL (ref 78.0–100.0)
Platelets: 232 10*3/uL (ref 150–400)
RDW: 16.7 % — ABNORMAL HIGH (ref 11.5–15.5)
WBC: 13.1 10*3/uL — ABNORMAL HIGH (ref 4.0–10.5)

## 2012-10-09 LAB — AMMONIA: Ammonia: 34 umol/L (ref 11–60)

## 2012-10-09 MED ORDER — INFLUENZA VAC SPLIT QUAD 0.5 ML IM SUSP
0.5000 mL | INTRAMUSCULAR | Status: AC
  Administered 2012-10-10: 0.5 mL via INTRAMUSCULAR
  Filled 2012-10-09: qty 0.5

## 2012-10-09 MED ORDER — ROPINIROLE HCL ER 6 MG PO TB24: 6.0000 mg | ORAL_TABLET | Freq: Every day | ORAL | Status: DC

## 2012-10-09 MED ORDER — ROPINIROLE HCL 1 MG PO TABS
1.5000 mg | ORAL_TABLET | Freq: Every evening | ORAL | Status: DC
  Administered 2012-10-09 – 2012-10-11 (×3): 1.5 mg via ORAL
  Filled 2012-10-09 (×4): qty 1

## 2012-10-09 MED ORDER — IPRATROPIUM BROMIDE 0.02 % IN SOLN: 0.5000 mg | Freq: Two times a day (BID) | RESPIRATORY_TRACT | Status: DC

## 2012-10-09 MED ORDER — ALBUTEROL SULFATE (5 MG/ML) 0.5% IN NEBU
2.5000 mg | INHALATION_SOLUTION | Freq: Three times a day (TID) | RESPIRATORY_TRACT | Status: DC
  Administered 2012-10-09 – 2012-10-12 (×10): 2.5 mg via RESPIRATORY_TRACT
  Filled 2012-10-09 (×10): qty 0.5

## 2012-10-09 MED ORDER — SIMVASTATIN 10 MG PO TABS
10.0000 mg | ORAL_TABLET | Freq: Every day | ORAL | Status: DC
  Administered 2012-10-09 – 2012-10-11 (×3): 10 mg via ORAL
  Filled 2012-10-09 (×4): qty 1

## 2012-10-09 MED ORDER — METOPROLOL TARTRATE 12.5 MG HALF TABLET
12.5000 mg | ORAL_TABLET | Freq: Two times a day (BID) | ORAL | Status: DC
  Administered 2012-10-09 (×2): 12.5 mg via ORAL
  Filled 2012-10-09 (×4): qty 1

## 2012-10-09 MED ORDER — IPRATROPIUM BROMIDE 0.02 % IN SOLN
0.5000 mg | Freq: Three times a day (TID) | RESPIRATORY_TRACT | Status: DC
  Administered 2012-10-09 – 2012-10-12 (×10): 0.5 mg via RESPIRATORY_TRACT
  Filled 2012-10-09 (×10): qty 2.5

## 2012-10-09 MED ORDER — ADULT MULTIVITAMIN W/MINERALS CH
1.0000 | ORAL_TABLET | Freq: Every day | ORAL | Status: DC
  Administered 2012-10-09 – 2012-10-12 (×4): 1 via ORAL
  Filled 2012-10-09 (×4): qty 1

## 2012-10-09 MED ORDER — ALBUTEROL SULFATE (5 MG/ML) 0.5% IN NEBU: 2.5000 mg | INHALATION_SOLUTION | Freq: Four times a day (QID) | RESPIRATORY_TRACT | Status: DC

## 2012-10-09 MED ORDER — ALBUTEROL SULFATE (5 MG/ML) 0.5% IN NEBU: 2.5000 mg | INHALATION_SOLUTION | Freq: Two times a day (BID) | RESPIRATORY_TRACT | Status: DC

## 2012-10-09 MED ORDER — LEVOFLOXACIN 750 MG PO TABS
750.0000 mg | ORAL_TABLET | ORAL | Status: DC
  Administered 2012-10-11: 11:00:00 750 mg via ORAL
  Filled 2012-10-09: qty 1

## 2012-10-09 MED ORDER — ROPINIROLE HCL 1 MG PO TABS
1.0000 mg | ORAL_TABLET | Freq: Every morning | ORAL | Status: DC
  Administered 2012-10-10 – 2012-10-12 (×3): 1 mg via ORAL
  Filled 2012-10-09 (×3): qty 1

## 2012-10-09 MED ORDER — PREDNISONE 1 MG PO TABS
3.0000 mg | ORAL_TABLET | Freq: Every day | ORAL | Status: DC
  Administered 2012-10-09 – 2012-10-12 (×4): 3 mg via ORAL
  Filled 2012-10-09 (×4): qty 3

## 2012-10-09 NOTE — Progress Notes (Signed)
With in off wide complex, asymptomatic, DR. Mcclung made aware and seen strip. No order given. Continue to monitor.

## 2012-10-09 NOTE — Progress Notes (Signed)
Pt alert and oriented upon arrival to unit but has become confused and slightly agitated. Bed alarm in place

## 2012-10-09 NOTE — Progress Notes (Signed)
Foley cath lavage done w/warm NS due to pea-sized blood clots noted w/in urometer .Brick red urine w/in urometer. Cath irrigated w/o diff (w/total 120 ml NS) . All NS returned . No further clots noted  .The patient  denied discomfort and tol procedure very well.

## 2012-10-09 NOTE — Progress Notes (Signed)
[facility] Progress Note [place] TEAM 1 - Stepdown ICU Team   Najeh Credit NWG:[id number] DOB: [birth year] DOA: 10/07/2012 PCP: [facility]  Brief narrative: 79 year old male patient with known history of COPD recently treated with Levaquin for pneumonia. He also has atrial fibrillation and is on chronic Pradaxa. He was sent to the ER for intermittent episodes of lethargy and confusion. He was initially sent to his primary care physician's office where he was noted to be hypoxic. He'd been having nonproductive cough but had not been having any type of chest discomfort. No constitutional symptoms. Chest x-ray in the ER revealed only mild vascular congestion. CT of the head was negative for acute changes. In the ER he received supplemental oxygen via nasal cannula but became more lethargic at which point an ABG was obtained with a pH of 7.26 and a PCO2 of 79 PO2 94. By the time the admitting physician came to evaluate the patient he was alert and able to converse meaningfully and was not having any respiratory symptoms. His white count was 11,000 - he did have mild hyponatremia and borderline hyperkalemia. A Foley catheter was inserted in the emergency department and subsequently patient was noted with hematuria.  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, but pt remains confused (unable to tell me where he lives or who his doctors are) - check B12, folate, and ammonia - f/u urine cx (though UA not convincing of UTI) - CT head at admit was unrevealing, w/ exception to advanced small vessel disease  Chronic RHF -ECHO this admit reveals RV moderately dilated with mild to moderate systolic dysfunction - currently appears compensated -BP has improved/normalized with gentle  IVF  Hyperkalemia / Hyponatremia / DH -likely due to [facility] originally - improving  -holdingToprol and Lasix  Hematuria -suspect due to foley trauma - UA somewhat abnormal - follow urine cx  Atrial fibrillation -rate controlled  -cont Pradaxa  HX of HTN (hypertension) -BP has been soft - currently well controlled off BB and lasix - slowly resume BB and follow   S/P Pacemaker  DVT prophylaxis: Pradaxa Code Status: Full Family Communication: Patient only - no family in room at time of examination Disposition Plan/Expected LOS: stable for trnasfer to tele bed - cont eval for AMS - begin PT/OT - suspect pt may require placement   Isolation: Contact isolation for MRSA PCR positive status  Consultants: None  Procedures: TTE - Left ventricle: The cavity size was normal. There was moderate concentric hypertrophy. Systolic function was normal. Wall motion was normal; there were no regional wall motion abnormalities. - Ventricular septum: Septal motion showed paradox. These changes are consistent with a left bundle branch block. - Left atrium: The atrium was moderately dilated. - Right ventricle: The cavity size was moderately dilated. Wall thickness was normal. Systolic function was mildly to moderately reduced. - Right atrium: The atrium was moderately to severely dilated. - Tricuspid valve: Moderate regurgitation. - Pulmonary arteries: PA peak pressure: 54mm Hg (S).  Antibiotics: Levaquin 10/3 >>>  HPI/Subjective: Pt is alert and conversant, but confused.  Can not provide a reliable hx.  Can not tell me where he is, or why he is here.    Objective: Blood pressure 110/57, pulse 77, temperature 98.1 F (36.7 C), temperature source Axillary, resp. rate 12, height 6'  1" (1.854 m), weight 92 kg (202 lb 13.2 oz), SpO2 96.00%.  Intake/Output Summary (Last 24 hours) at 10/09/12 1031 Last data filed at 10/09/12 0800  Gross per 24 hour  Intake   1975 ml  Output    915 ml  Net    1060 ml   Exam: General: No acute respiratory distress at rest  Lungs: Coarse to auscultation bilaterally with only minor scattered wheezes and no crackles Cardiovascular: Regular rate and rhythm without murmur gallop or rub - no peripheral edema or JVD Abdomen: Nontender, nondistended, soft, bowel sounds positive, no rebound, no ascites, no appreciable mass Musculoskeletal: No significant cyanosis, clubbing of bilateral lower extremities Neurological: Alert and oriented x name only, moves all extremities x 4 without focal neurological deficits  Scheduled Meds:  Scheduled Meds: . albuterol  2.5 mg Nebulization BID  . antiseptic oral rinse  15 mL Mouth Rinse BID  . Chlorhexidine Gluconate Cloth  6 each Topical Q0600  . dabigatran  150 mg Oral BID  . [START ON 10/10/2012] influenza vac split quadrivalent PF  0.5 mL Intramuscular Tomorrow-1000  . ipratropium  0.5 mg Nebulization BID  . [START ON 10/11/2012] levofloxacin  750 mg Oral Q48H  . levothyroxine  200 mcg Oral QAC breakfast  . mupirocin ointment  1 application Nasal BID  . pantoprazole  20 mg Oral Daily  . predniSONE  1 mg Oral Q breakfast  . rOPINIRole  0.5 mg Oral QHS  . simvastatin  10 mg Oral q1800  . sodium chloride  3 mL Intravenous Q12H    Data Reviewed: Basic Metabolic Panel:  Recent Labs Lab 10/07/12 1847 10/09/12 0455  NA 133* 132*  K 5.2* 4.2  CL 97 93*  CO2 26 33*  GLUCOSE 99 87  BUN 21 26*  CREATININE 0.90 1.31  CALCIUM 8.9 8.7   Liver Function Tests: No results found for this basename: AST, ALT, ALKPHOS, BILITOT, PROT, ALBUMIN,  in the last 168 hours  CBC:  Recent Labs Lab 10/07/12 1612 10/09/12 0455  WBC 11.1* 13.1*  NEUTROABS 7.5  --   HGB 10.6* 10.0*  HCT 33.2* 32.8*  MCV 92.2 94.5  PLT 244 232   Cardiac Enzymes:  Recent Labs Lab 10/07/12 2305 10/08/12 0500 10/08/12 1057  TROPONINI <0.30 <0.30 <0.30    Recent Results (from the past 240 hour(s))  MRSA PCR SCREENING     Status:  Abnormal   Collection Time    10/07/12 11:26 PM      Result Value Range Status   MRSA by PCR POSITIVE (*) NEGATIVE Final   Comment:            The GeneXpert MRSA Assay (FDA     approved for NASAL specimens     only), is one component of a     comprehensive MRSA colonization     surveillance program. It is not     intended to diagnose MRSA     infection nor to guide or     monitor treatment for     MRSA infections.     RESULT CALLED TO, READ BACK BY AND VERIFIED WITH:     Rocco Pauls RN [phone] GREEN R    Studies:  Recent x-ray studies have been reviewed in detail by the Attending Physician  Lonia Blood, MD [facility] Office  [phone] Pager [phone]  On-Call/Text Page:      [email or web address]      password [email or web address]  If 7PM-7AM, please contact night-coverage  [email or web address] Password [email or web address] 10/09/2012, 10:31 AM   LOS: 2 days

## 2012-10-09 NOTE — ED Provider Notes (Signed)
Medical screening examination/treatment/procedure(s) were conducted as a shared visit with non-physician practitioner(s) and myself.  I personally evaluated the patient during the encounter  Patient seen and examined. He really has minimal complaints. For a while he seemed a little confused ABG was obtained. This does show hypercapnic respiratory failure with a PCO2 of 78.  However upon reexam he is awake alert is not breathing any difficulty. Adequate depth and regular respiration. I do not feel he requires mechanical ventilation. He does appear to be in chronic congestive heart failure. Remains hypoxemic. Dr. Conley Rolls in the emergency room plan admission  Roney Marion, MD 10/09/12 [phone]

## 2012-10-09 NOTE — Progress Notes (Signed)
Transferred to [id number] by wheelchair, stable, report given to RN. Belongings with pt.

## 2012-10-09 NOTE — Progress Notes (Signed)
Wife made aware about the transfer and gave the room #.

## 2012-10-10 DIAGNOSIS — R0609 Other forms of dyspnea: Secondary | ICD-10-CM

## 2012-10-10 DIAGNOSIS — J9600 Acute respiratory failure, unspecified whether with hypoxia or hypercapnia: Principal | ICD-10-CM

## 2012-10-10 DIAGNOSIS — I10 Essential (primary) hypertension: Secondary | ICD-10-CM

## 2012-10-10 LAB — URINE CULTURE
Colony Count: NO GROWTH
Culture: NO GROWTH

## 2012-10-10 LAB — CBC
HCT: 29.9 % — ABNORMAL LOW (ref 39.0–52.0)
MCH: 28.8 pg (ref 26.0–34.0)
MCHC: 30.8 g/dL (ref 30.0–36.0)
MCV: 93.7 fL (ref 78.0–100.0)
Platelets: 218 10*3/uL (ref 150–400)
RDW: 16.3 % — ABNORMAL HIGH (ref 11.5–15.5)
WBC: 11.4 10*3/uL — ABNORMAL HIGH (ref 4.0–10.5)

## 2012-10-10 MED ORDER — METOPROLOL TARTRATE 25 MG PO TABS
25.0000 mg | ORAL_TABLET | Freq: Two times a day (BID) | ORAL | Status: DC
  Administered 2012-10-10 – 2012-10-12 (×5): 25 mg via ORAL
  Filled 2012-10-10 (×6): qty 1

## 2012-10-10 MED ORDER — PREDNISONE 20 MG PO TABS
40.0000 mg | ORAL_TABLET | Freq: Every day | ORAL | Status: DC
  Administered 2012-10-11 – 2012-10-12 (×2): 40 mg via ORAL
  Filled 2012-10-10 (×3): qty 2

## 2012-10-10 NOTE — Evaluation (Signed)
Physical Therapy Evaluation Patient Details Name: Christopher Buchanan MRN: [id number] DOB: 04-06-1920 Today's Date: 10/10/2012 Time: 4098-1191 PT Time Calculation (min): 25 min  PT Assessment / Plan / Recommendation History of Present Illness    79 year old male patient admitted for hypoxia.  Acute respiratory failure, COPD exacerbation, encephalopathy, HF, pulmonary HTN, Hx afib on pradaxa and hx HTN   Clinical Impression  Pt presents with decreased functional mobility versus baseline, now dependent on RW and human supervision for safety, will benefit from PT to address gait, strength, balance and mobility deficits listed below.  Recommend 1-2 days acute PT to enable transition to home with wife and HHPT.  Has all equipment.     PT Assessment  Patient needs continued PT services    Follow Up Recommendations  Home health PT;Supervision/Assistance - 24 hour    Does the patient have the potential to tolerate intense rehabilitation      Barriers to Discharge        Equipment Recommendations  None recommended by PT    Recommendations for Other Services     Frequency Min 3X/week    Precautions / Restrictions Precautions Precautions: Fall Precaution Comments: weakness, uses device at baseline, hx falls Restrictions Weight Bearing Restrictions: No Other Position/Activity Restrictions: premorbid right shoulder ROM limitations   Pertinent Vitals/Pain 123/67 after walking, HR 81, minimal SOB noted      Mobility  Bed Mobility Bed Mobility: Not assessed (up in /back to chair) Transfers Transfers: Sit to Stand;Stand to Sit Sit to Stand: 3: Mod assist;With upper extremity assist;From chair/3-in-1;With armrests Stand to Sit: 4: Min assist;With upper extremity assist;To chair/3-in-1;With armrests Details for Transfer Assistance: instructional cues to scoot out to stand, initially up to 50% assist progressing to less than 10% over time Ambulation/Gait Ambulation/Gait Assistance: 4: Min  assist Ambulation Distance (Feet): 100 Feet Assistive device: Rolling walker Ambulation/Gait Assistance Details: instructional cues for posture, proximity to RW, to monitor breathing and for lightheadedness, pt asymptomatic but with poor muscular endurance and very slow gait Gait Pattern: Step-through pattern;Decreased stride length;Shuffle;Narrow base of support;Trunk flexed General Gait Details: pt states he's walking much worse than before when he had been able to use cane in the house with HHPT and now says "I'm shuffling" Stairs: No Wheelchair Mobility Wheelchair Mobility: No    Exercises Other Exercises Other Exercises: seated sustain leg extension with df/pf at ankle 10x/hour Other Exercises: seated repeated long arc quad 10x/hour   PT Diagnosis: Difficulty walking  PT Problem List: Cardiopulmonary status limiting activity;Decreased safety awareness;Decreased knowledge of use of DME;Decreased cognition;Decreased mobility;Decreased balance;Decreased activity tolerance;Decreased strength;Decreased range of motion PT Treatment Interventions: Patient/family education;Balance training;Therapeutic exercise;Therapeutic activities;Functional mobility training;Gait training;DME instruction     PT Goals(Current goals can be found in the care plan section) Acute Rehab PT Goals Patient Stated Goal: [facility] [employer] with daughter PT Goal Formulation: With patient/family Time For Goal Achievement: 10/24/12 Potential to Achieve Goals: Good  Visit Information  Last PT Received On: 10/10/12 Assistance Needed: +1       Prior Functioning  Home Living Family/patient expects to be discharged to:: Private residence Living Arrangements: Spouse/significant other;Children Available Help at Discharge: Family Type of Home: Apartment Home Access: Level entry Home Layout: One level Home Equipment: Environmental consultant - 2 wheels;Cane - single point;Tub bench;Grab bars - tub/shower;Hand held shower  head;Bedside commode Additional Comments: recently moved to handicap accessible apartment Prior Function Level of Independence: Needs assistance Gait / Transfers Assistance Needed: Indep with cane/RW ADL's / Homemaking  Assistance Needed: dressing UE due to R shoulder, supervision for bathing Comments: wife helps, daughter supportive Communication Communication: No difficulties;HOH    Cognition  Cognition Arousal/Alertness: Awake/alert Behavior During Therapy: WFL for tasks assessed/performed Overall Cognitive Status: Difficult to assess Difficult to assess due to:  (inconsistent history of mobility/use of device, wife confirm)    Extremity/Trunk Assessment Upper Extremity Assessment Upper Extremity Assessment: RUE deficits/detail RUE Deficits / Details: premorbid shoulder restricted to minimal flex/ext/abd Lower Extremity Assessment Lower Extremity Assessment: Generalized weakness   Balance High Level Balance High Level Balance Comments: in general pt is dependent upon external support for stability and balance, improves with use of RW and progresses from moderate assist to supervision short distance.  End of Session PT - End of Session Equipment Utilized During Treatment: Gait belt Activity Tolerance: Patient limited by fatigue;Patient tolerated treatment well Patient left: in chair;with family/visitor present;with call bell/phone within reach Nurse Communication: Mobility status  GP     Dennis Bast 10/10/2012, 2:53 PM

## 2012-10-10 NOTE — Progress Notes (Signed)
Urine almost clear this am. Foley d/c'd at 12:00

## 2012-10-10 NOTE — Progress Notes (Signed)
[facility] PROGRESS NOTE Interim History: 79 year old male patient with known history of COPD recently treated with Levaquin for pneumonia. He also has atrial fibrillation and is on chronic Pradaxa. He was sent to the ER for intermittent episodes of lethargy and confusion. He was initially sent to his primary care physician's office where he was noted to be hypoxic. He'd been having nonproductive cough but had not been having any type of chest discomfort. No constitutional symptoms. Chest x-ray in the ER revealed only mild vascular congestion. CT of the head was negative for acute changes. In the ER he received supplemental oxygen via nasal cannula but became more lethargic at which point an ABG was obtained with a pH of 7.26 and a PCO2 of 79 PO2 94. By the time the admitting physician came to evaluate the patient he was alert and able to converse meaningfully and was not having any respiratory symptoms. His white count was 11,000 - he did have mild hyponatremia and borderline hyperkalemia. A Foley catheter was inserted in the emergency department and subsequently patient was noted with hematuria   Assessment/Plan: Acute respiratory failure with hypoxia and hypercapnia due to acute COPD exacerbation  - Now stable on [place] oxygen with only minimal wheeze on exam  - Cont Levaquin to cover for PNA/bronchitis. - Start steroids. Cont inhalers.  Encephalopathy - idiopathic  - MS has improved overall, ? due to Hypercarbia. - B12 1735 , folate >20.0, ammonia 34  - Urine Cx negative - CT head at admit was unrevealing, w/ exception to advanced small vessel disease.  Chronic Systolic RHF/Mild pulmonary HTN:  - ECHO 10.3.2014: this admit reveals RV moderately dilated with mild to moderate systolic dysfunction. - BP has improved/normalized with gentle IVF.  Hyperkalemia / Hyponatremia / DH  -likely due to Dehydration originally - improving  -Resume metoprolol.  Hematuria  -suspect due to foley  trauma. - Ucx negative.  Atrial fibrillation  -rate controlled  -cont Pradaxa   HX of HTN (hypertension)  -BP improving nicely.   Code Status: full Family Communication: none  Disposition Plan: home in am   Consultants:  none  Procedures: Echo 10.3.2014: The right ventricular systolic pressure was increased consistent with mild pulmonary hypertension.    Antibiotics:  levaquin  HPI/Subjective: Relates he feels better  Objective: Filed Vitals:   10/09/12 1531 10/09/12 1700 10/09/12 2102 10/10/12 0731  BP: 94/60 112/70 100/58 128/67  Pulse: 47 69 74 62  Temp: 98.6 F (37 C) 98.1 F (36.7 C) 97.9 F (36.6 C) 97.8 F (36.6 C)  TempSrc: Oral Oral Oral Oral  Resp: 21 22 22 20   Height:  6\' 1"  (1.854 m)    Weight:  91.808 kg (202 lb 6.4 oz)  92.2 kg (203 lb 4.2 oz)  SpO2: 96% 96% 96% 95%    Intake/Output Summary (Last 24 hours) at 10/10/12 0843 Last data filed at 10/10/12 4098  Gross per 24 hour  Intake   1690 ml  Output   1825 ml  Net   -135 ml   Filed Weights   10/09/12 0800 10/09/12 1700 10/10/12 0731  Weight: 92 kg (202 lb 13.2 oz) 91.808 kg (202 lb 6.4 oz) 92.2 kg (203 lb 4.2 oz)    Exam:  General: Alert, awake, oriented x3, in no acute distress.  HEENT: No bruits, no goiter.  Heart: Regular rate and rhythm, without murmurs, rubs, gallops.  Lungs: Good air movement, wheezing B/l Abdomen: Soft, nontender, nondistended, positive bowel sounds.  Neuro: Grossly intact, nonfocal.  Data Reviewed: Basic Metabolic Panel:  Recent Labs Lab 10/07/12 1847 10/09/12 0455  NA 133* 132*  K 5.2* 4.2  CL 97 93*  CO2 26 33*  GLUCOSE 99 87  BUN 21 26*  CREATININE 0.90 1.31  CALCIUM 8.9 8.7   Liver Function Tests: No results found for this basename: AST, ALT, ALKPHOS, BILITOT, PROT, ALBUMIN,  in the last 168 hours No results found for this basename: LIPASE, AMYLASE,  in the last 168 hours  Recent Labs Lab 10/09/12 1405  AMMONIA 34    CBC:  Recent Labs Lab 10/07/12 1612 10/09/12 0455 10/10/12 0418  WBC 11.1* 13.1* 11.4*  NEUTROABS 7.5  --   --   HGB 10.6* 10.0* 9.2*  HCT 33.2* 32.8* 29.9*  MCV 92.2 94.5 93.7  PLT 244 232 218   Cardiac Enzymes:  Recent Labs Lab 10/07/12 2305 10/08/12 0500 10/08/12 1057  TROPONINI <0.30 <0.30 <0.30   BNP (last 3 results) No results found for this basename: PROBNP,  in the last 8760 hours CBG: No results found for this basename: GLUCAP,  in the last 168 hours  Recent Results (from the past 240 hour(s))  CULTURE, BLOOD (ROUTINE X 2)     Status: None   Collection Time    10/07/12  6:24 PM      Result Value Range Status   Specimen Description BLOOD HAND RIGHT   Final   Special Requests BOTTLES DRAWN AEROBIC ONLY 3CC   Final   Culture  Setup Time     Final   Value: 10/08/2012 05:49     Performed at [facility]   Culture     Final   Value:        BLOOD CULTURE RECEIVED NO GROWTH TO DATE CULTURE WILL BE HELD FOR 5 DAYS BEFORE ISSUING A FINAL NEGATIVE REPORT     Performed at [facility]   Report Status PENDING   Incomplete  MRSA PCR SCREENING     Status: Abnormal   Collection Time    10/07/12 11:26 PM      Result Value Range Status   MRSA by PCR POSITIVE (*) NEGATIVE Final   Comment:            The GeneXpert MRSA Assay (FDA     approved for NASAL specimens     only), is one component of a     comprehensive MRSA colonization     surveillance program. It is not     intended to diagnose MRSA     infection nor to guide or     monitor treatment for     MRSA infections.     RESULT CALLED TO, READ BACK BY AND VERIFIED WITH:     Rocco Pauls RN [phone] GREEN R  URINE CULTURE     Status: None   Collection Time    10/08/12  3:30 PM      Result Value Range Status   Specimen Description URINE, CATHETERIZED   Final   Special Requests NONE   Final   Culture  Setup Time     Final   Value: 10/08/2012 16:56     Performed at [facility]    Colony Count     Final   Value: NO GROWTH     Performed at [facility]   Culture     Final   Value: NO GROWTH     Performed at [facility]   Report Status 10/10/2012  FINAL   Final     Studies: No results found.  Scheduled Meds: . albuterol  2.5 mg Nebulization TID  . antiseptic oral rinse  15 mL Mouth Rinse BID  . Chlorhexidine Gluconate Cloth  6 each Topical Q0600  . dabigatran  150 mg Oral BID  . influenza vac split quadrivalent PF  0.5 mL Intramuscular Tomorrow-1000  . ipratropium  0.5 mg Nebulization TID  . [START ON 10/11/2012] levofloxacin  750 mg Oral Q48H  . levothyroxine  200 mcg Oral QAC breakfast  . metoprolol tartrate  25 mg Oral BID  . multivitamin with minerals  1 tablet Oral Daily  . mupirocin ointment  1 application Nasal BID  . pantoprazole  20 mg Oral Daily  . predniSONE  3 mg Oral Daily  . [START ON 10/11/2012] predniSONE  40 mg Oral Q breakfast  . rOPINIRole  1 mg Oral q morning - 10a  . rOPINIRole  1.5 mg Oral QPM  . Ropinirole HCl  6 mg Oral Daily  . simvastatin  10 mg Oral q1800  . sodium chloride  3 mL Intravenous Q12H   Continuous Infusions: . sodium chloride 50 mL/hr (10/08/12 1529)     Radonna Ricker Rosine Beat  [facility] Pager [phone]. If 8PM-8AM, please contact night-coverage at [email or web address], password [facility] 10/10/2012, 8:43 AM  LOS: 3 days

## 2012-10-11 MED ORDER — SODIUM CHLORIDE 0.9 % IV BOLUS (SEPSIS)
250.0000 mL | Freq: Once | INTRAVENOUS | Status: AC
  Administered 2012-10-11: 15:00:00 250 mL via INTRAVENOUS

## 2012-10-11 MED ORDER — FUROSEMIDE 20 MG PO TABS
20.0000 mg | ORAL_TABLET | Freq: Every day | ORAL | Status: DC
  Administered 2012-10-11: 11:00:00 20 mg via ORAL
  Filled 2012-10-11: qty 1

## 2012-10-11 NOTE — Progress Notes (Signed)
Utilization Review Completed.   Marbin Olshefski, RN, BSN Nurse Case Manager  [phone]  

## 2012-10-11 NOTE — Progress Notes (Signed)
SATURATION QUALIFICATIONS: (This note is used to comply with regulatory documentation for home oxygen)  Patient Saturations on Room Air at Rest = 92%  Patient Saturations on Room Air while Ambulating = 81%  Patient Saturations on 3 Liters of oxygen while Ambulating = 96%  Please briefly explain why patient needs home oxygen: Patient only took about 4 steps and oxygen saturations decreased to 81%, 2lnc was applied and patient's O2 sats increase to only 86-89% so oxygen was increased to 3lnc and patient's O2 sats increased to 96%.

## 2012-10-11 NOTE — Progress Notes (Addendum)
[facility] PROGRESS NOTE Interim History: 79 year old male patient with known history of COPD recently treated with Levaquin for pneumonia. He also has atrial fibrillation and is on chronic Pradaxa. He was sent to the ER for intermittent episodes of lethargy and confusion. He was initially sent to his primary care physician's office where he was noted to be hypoxic. He'd been having nonproductive cough but had not been having any type of chest discomfort. No constitutional symptoms. Chest x-ray in the ER revealed only mild vascular congestion. CT of the head was negative for acute changes. In the ER he received supplemental oxygen via nasal cannula but became more lethargic at which point an ABG was obtained with a pH of 7.26 and a PCO2 of 79 PO2 94. By the time the admitting physician came to evaluate the patient he was alert and able to converse meaningfully and was not having any respiratory symptoms. His white count was 11,000 - he did have mild hyponatremia and borderline hyperkalemia. A Foley catheter was inserted in the emergency department and subsequently patient was noted with hematuria   Assessment/Plan: Acute respiratory failure with hypoxia and hypercapnia due to acute COPD exacerbation  - Now stable on [place] oxygen with only minimal wheeze on exam  - Cont Levaquin to cover for PNA/bronchitis. - Start steroids. Cont inhalers. - check pulse oximetry with ambulation.  Chronic Systolic RHF/Mild pulmonary HTN:  - ECHO 10.3.2014: this admit reveals RV moderately dilated with mild to moderate systolic dysfunction. - BP has improved, restart lasix.  Encephalopathy - idiopathic  - MS has improved overall, ? due to Hypercarbia. - B12 1735 , folate >20.0, ammonia 34  - Urine Cx negative - CT head at admit was unrevealing, w/ exception to advanced small vessel disease.  Hyperkalemia / Hyponatremia / DH  -likely due to Dehydration originally - improving  -Resume metoprolol.  Hematuria   - Suspect due to foley trauma. - Ucx negative.  Atrial fibrillation  -rate controlled  -cont Pradaxa   HX of HTN (hypertension)  -BP improving nicely.   Code Status: full Family Communication: none  Disposition Plan: home in am   Consultants:  none  Procedures: Echo 10.3.2014: The right ventricular systolic pressure was increased consistent with mild pulmonary hypertension.    Antibiotics:  levaquin  HPI/Subjective: Relates he feels better  Objective: Filed Vitals:   10/10/12 2212 10/11/12 0421 10/11/12 0646 10/11/12 0851  BP: 159/84  114/72   Pulse: 64  62   Temp: 97.9 F (36.6 C)  98.2 F (36.8 C)   TempSrc: Oral  Oral   Resp: 19  18   Height:      Weight:  92.9 kg (204 lb 12.9 oz)    SpO2: 90%  94% 90%    Intake/Output Summary (Last 24 hours) at 10/11/12 1001 Last data filed at 10/11/12 0845  Gross per 24 hour  Intake    660 ml  Output    800 ml  Net   -140 ml   Filed Weights   10/09/12 1700 10/10/12 0731 10/11/12 0421  Weight: 91.808 kg (202 lb 6.4 oz) 92.2 kg (203 lb 4.2 oz) 92.9 kg (204 lb 12.9 oz)    Exam:  General: Alert, awake, oriented x3, in no acute distress.  HEENT: No bruits, no goiter.  Heart: Regular rate and rhythm, without murmurs, rubs, gallops.  Lungs: Good air movement, wheezing B/l Abdomen: Soft, nontender, nondistended, positive bowel sounds.  Neuro: Grossly intact, nonfocal.   Data Reviewed: Basic Metabolic  Panel:  Recent Labs Lab 10/07/12 1847 10/09/12 0455  NA 133* 132*  K 5.2* 4.2  CL 97 93*  CO2 26 33*  GLUCOSE 99 87  BUN 21 26*  CREATININE 0.90 1.31  CALCIUM 8.9 8.7   Liver Function Tests: No results found for this basename: AST, ALT, ALKPHOS, BILITOT, PROT, ALBUMIN,  in the last 168 hours No results found for this basename: LIPASE, AMYLASE,  in the last 168 hours  Recent Labs Lab 10/09/12 1405  AMMONIA 34   CBC:  Recent Labs Lab 10/07/12 1612 10/09/12 0455 10/10/12 0418  WBC 11.1* 13.1*  11.4*  NEUTROABS 7.5  --   --   HGB 10.6* 10.0* 9.2*  HCT 33.2* 32.8* 29.9*  MCV 92.2 94.5 93.7  PLT 244 232 218   Cardiac Enzymes:  Recent Labs Lab 10/07/12 2305 10/08/12 0500 10/08/12 1057  TROPONINI <0.30 <0.30 <0.30   BNP (last 3 results) No results found for this basename: PROBNP,  in the last 8760 hours CBG: No results found for this basename: GLUCAP,  in the last 168 hours  Recent Results (from the past 240 hour(s))  CULTURE, BLOOD (ROUTINE X 2)     Status: None   Collection Time    10/07/12  6:24 PM      Result Value Range Status   Specimen Description BLOOD HAND RIGHT   Final   Special Requests BOTTLES DRAWN AEROBIC ONLY 3CC   Final   Culture  Setup Time     Final   Value: 10/08/2012 05:49     Performed at [facility]   Culture     Final   Value:        BLOOD CULTURE RECEIVED NO GROWTH TO DATE CULTURE WILL BE HELD FOR 5 DAYS BEFORE ISSUING A FINAL NEGATIVE REPORT     Performed at [facility]   Report Status PENDING   Incomplete  MRSA PCR SCREENING     Status: Abnormal   Collection Time    10/07/12 11:26 PM      Result Value Range Status   MRSA by PCR POSITIVE (*) NEGATIVE Final   Comment:            The GeneXpert MRSA Assay (FDA     approved for NASAL specimens     only), is one component of a     comprehensive MRSA colonization     surveillance program. It is not     intended to diagnose MRSA     infection nor to guide or     monitor treatment for     MRSA infections.     RESULT CALLED TO, READ BACK BY AND VERIFIED WITH:     Rocco Pauls RN [phone] GREEN R  URINE CULTURE     Status: None   Collection Time    10/08/12  3:30 PM      Result Value Range Status   Specimen Description URINE, CATHETERIZED   Final   Special Requests NONE   Final   Culture  Setup Time     Final   Value: 10/08/2012 16:56     Performed at [facility]   Colony Count     Final   Value: NO GROWTH     Performed at [facility]    Culture     Final   Value: NO GROWTH     Performed at [facility]   Report Status 10/10/2012 FINAL   Final  Studies: No results found.  Scheduled Meds: . albuterol  2.5 mg Nebulization TID  . antiseptic oral rinse  15 mL Mouth Rinse BID  . Chlorhexidine Gluconate Cloth  6 each Topical Q0600  . dabigatran  150 mg Oral BID  . ipratropium  0.5 mg Nebulization TID  . levofloxacin  750 mg Oral Q48H  . levothyroxine  200 mcg Oral QAC breakfast  . metoprolol tartrate  25 mg Oral BID  . multivitamin with minerals  1 tablet Oral Daily  . mupirocin ointment  1 application Nasal BID  . pantoprazole  20 mg Oral Daily  . predniSONE  3 mg Oral Daily  . predniSONE  40 mg Oral Q breakfast  . rOPINIRole  1 mg Oral q morning - 10a  . rOPINIRole  1.5 mg Oral QPM  . Ropinirole HCl  6 mg Oral Daily  . simvastatin  10 mg Oral q1800  . sodium chloride  3 mL Intravenous Q12H   Continuous Infusions:     Marinda Elk  [facility] Pager [phone]. If 8PM-8AM, please contact night-coverage at [email or web address], password [facility] 10/11/2012, 10:01 AM  LOS: 4 days

## 2012-10-11 NOTE — Evaluation (Signed)
Occupational Therapy Evaluation Patient Details Name: Christopher Buchanan MRN: [id number] DOB: 1920/03/30 Today's Date: 10/11/2012 Time: 0347-4259 OT Time Calculation (min): 28 min  OT Assessment / Plan / Recommendation History of present illness 79 year old male patient with known history of COPD recently treated with Levaquin for pneumonia. He also has atrial fibrillation and is on chronic Pradaxa. He was sent to the ER for intermittent episodes of lethargy and confusion. He was initially sent to his primary care physician's office where he was noted to be hypoxic   Clinical Impression   Pt presents with impaired mobility and balance with decreased activity tolerance interfering with ability to perform ADL. Pt generally requires min to moderate assistance for ADL.  Will benefit from acute OT to address self care and instruct in energy conservation to decrease burden of care on his family upon return home.      OT Assessment  Patient needs continued OT Services    Follow Up Recommendations  Home health OT;Supervision/Assistance - 24 hour    Barriers to Discharge      Equipment Recommendations  None recommended by OT    Recommendations for Other Services    Frequency  Min 2X/week    Precautions / Restrictions Precautions Precautions: Fall Precaution Comments: weakness, uses device at baseline, hx falls Restrictions Weight Bearing Restrictions: No Other Position/Activity Restrictions: premorbid right shoulder ROM limitations   Pertinent Vitals/Pain VSS, on RA, no pain reported    ADL  Eating/Feeding: Independent Where Assessed - Eating/Feeding: Chair Grooming: Teeth care;Brushing hair;Minimal assistance Where Assessed - Grooming: Unsupported sitting;Supported standing Upper Body Bathing: Minimal assistance Where Assessed - Upper Body Bathing: Unsupported sitting Lower Body Bathing: Moderate assistance Where Assessed - Lower Body Bathing: Unsupported sitting;Supported sit to  stand Upper Body Dressing: Minimal assistance Where Assessed - Upper Body Dressing: Unsupported sitting Lower Body Dressing: Moderate assistance Where Assessed - Lower Body Dressing: Unsupported sitting;Supported sit to stand Toilet Transfer: Minimal assistance Toilet Transfer Method: Sit to stand Toilet Transfer Equipment: Bedside commode Toileting - Clothing Manipulation and Hygiene: Minimal assistance Where Assessed - Glass blower/designer Manipulation and Hygiene: Standing Equipment Used: Gait belt;Rolling walker Transfers/Ambulation Related to ADLs: min assist with RW ADL Comments: Pt stood at sink for one grooming activity and then required a sitting rest break before returning to his chair and completing a second grooming task.    OT Diagnosis: Generalized weakness;Cognitive deficits  OT Problem List: Decreased strength;Decreased activity tolerance;Decreased range of motion;Decreased cognition;Decreased safety awareness;Cardiopulmonary status limiting activity OT Treatment Interventions: Self-care/ADL training;Balance training;Patient/family education;Energy conservation;Therapeutic activities   OT Goals(Current goals can be found in the care plan section) Acute Rehab OT Goals Patient Stated Goal: [facility] Sneakers with daughter OT Goal Formulation: With patient Time For Goal Achievement: 10/25/12 Potential to Achieve Goals: Good ADL Goals Pt Will Perform Grooming: with supervision;standing (x 3 activities) Pt Will Perform Lower Body Bathing: with supervision;sit to/from stand Pt Will Perform Lower Body Dressing: with supervision;sit to/from stand Pt Will Transfer to Toilet: with supervision;ambulating;bedside commode (3 in1 over toilet) Pt Will Perform Toileting - Clothing Manipulation and hygiene: with supervision;sit to/from stand Pt Will Perform Tub/Shower Transfer: with supervision;ambulating;shower seat;Shower transfer  Visit Information  Last OT Received On:  10/11/12 Assistance Needed: +1 History of Present Illness: 79 year old male patient with known history of COPD recently treated with Levaquin for pneumonia. He also has atrial fibrillation and is on chronic Pradaxa. He was sent to the ER for intermittent episodes of lethargy and confusion. He was  initially sent to his primary care physician's office where he was noted to be hypoxic       Prior Functioning     Home Living Family/patient expects to be discharged to:: Private residence Living Arrangements: Spouse/significant other;Children Available Help at Discharge: Family Type of Home: Apartment Home Access: Level entry Home Layout: One level Home Equipment: Walker - 2 wheels;Grab bars - tub/shower;Hand held shower head;Bedside commode;Shower seat;Cane - quad Additional Comments: recently moved to handicap accessible apartment Prior Function Level of Independence: Needs assistance Gait / Transfers Assistance Needed: Indep with cane/RW ADL's / Homemaking Assistance Needed: dressing UE due to R shoulder, supervision for bathing Comments: wife helps, daughter supportive Communication Communication: No difficulties;HOH Dominant Hand: Right         Vision/Perception Vision - History Baseline Vision: Wears glasses all the time Patient Visual Report: No change from baseline   Cognition  Cognition Arousal/Alertness: Awake/alert Behavior During Therapy: WFL for tasks assessed/performed Overall Cognitive Status: Impaired/Different from baseline Area of Impairment: Memory;Safety/judgement Memory: Decreased short-term memory Safety/Judgement: Decreased awareness of deficits    Extremity/Trunk Assessment Upper Extremity Assessment Upper Extremity Assessment: RUE deficits/detail RUE Deficits / Details: premorbid shoulder restricted to minimal flex/ext/abd Lower Extremity Assessment Lower Extremity Assessment: Defer to PT evaluation     Mobility Bed Mobility Bed Mobility: Not  assessed Rolling Left: 4: Min assist Left Sidelying to Sit: 4: Min assist;With rails;HOB flat Details for Bed Mobility Assistance: cueing for sequence with assist to elevate trunk and fully pivot hips to EOB Transfers Transfers: Sit to Stand;Stand to Sit Sit to Stand: 4: Min assist;With upper extremity assist;From chair/3-in-1 Stand to Sit: 4: Min assist;With upper extremity assist;To chair/3-in-1 Details for Transfer Assistance: cueing for hand placement, sequence and scooting to the edge     Exercise General Exercises - Lower Extremity Long Arc Quad: AROM;Both;20 reps;Seated Hip ABduction/ADduction: AROM;Both;20 reps;Seated Hip Flexion/Marching: AROM;Both;20 reps;Seated Toe Raises: AROM;Both;20 reps;Seated Heel Raises: AROM;Both;20 reps;Seated   Balance Balance Balance Assessed: Yes Static Standing Balance Static Standing - Balance Support: No upper extremity supported;During functional activity Static Standing - Level of Assistance: 5: Stand by assistance   End of Session OT - End of Session Activity Tolerance: Patient limited by fatigue Patient left: in chair;with call bell/phone within reach Nurse Communication:  (ok to give coffee)  GO     Evern Bio 10/11/2012, 12:27 PM [phone]

## 2012-10-11 NOTE — Progress Notes (Signed)
Physical Therapy Treatment Patient Details Name: Markeise Mathews MRN: [id number] DOB: 1920-11-16 Today's Date: 10/11/2012 Time: 0935-1000 PT Time Calculation (min): 25 min  PT Assessment / Plan / Recommendation  History of Present Illness 79 year old male patient with known history of COPD recently treated with Levaquin for pneumonia. He also has atrial fibrillation and is on chronic Pradaxa. He was sent to the ER for intermittent episodes of lethargy and confusion. He was initially sent to his primary care physician's office where he was noted to be hypoxic   PT Comments   Pt with limited ability to ambulate today with decreased distance from eval. Pt continues to require assist with all transfers and gait and states his wife can assist but she was not present to confirm or witness pt movement. Pt will continue to benefit from therapy to maximize function and strength. Pt encouraged to continue mobility with nursing and continue HEP throughout the day.  Follow Up Recommendations  Home health PT;Supervision/Assistance - 24 hour     Does the patient have the potential to tolerate intense rehabilitation     Barriers to Discharge        Equipment Recommendations       Recommendations for Other Services    Frequency     Progress towards PT Goals Progress towards PT goals: Progressing toward goals  Plan Current plan remains appropriate    Precautions / Restrictions Precautions Precautions: Fall Restrictions Weight Bearing Restrictions: No   Pertinent Vitals/Pain No pain    Mobility  Bed Mobility Bed Mobility: Rolling Left;Left Sidelying to Sit Rolling Left: 4: Min assist Left Sidelying to Sit: 4: Min assist;With rails;HOB flat Details for Bed Mobility Assistance: cueing for sequence with assist to elevate trunk and fully pivot hips to EOB Transfers Sit to Stand: 4: Min assist;From bed Stand to Sit: 4: Min assist;To chair/3-in-1;With armrests Details for Transfer Assistance: cueing  for hand placement, sequence and scooting to the edge Ambulation/Gait Ambulation/Gait Assistance: 4: Min assist Ambulation Distance (Feet): 70 Feet Assistive device: Rolling walker Ambulation/Gait Assistance Details: cueing for posture, position in RW and encouragement to increase distance, cues for breathing technique Gait Pattern: Step-through pattern;Decreased stride length;Narrow base of support;Trunk flexed Gait velocity: decreased    Exercises General Exercises - Lower Extremity Long Arc Quad: AROM;Both;20 reps;Seated Hip ABduction/ADduction: AROM;Both;20 reps;Seated Hip Flexion/Marching: AROM;Both;20 reps;Seated Toe Raises: AROM;Both;20 reps;Seated Heel Raises: AROM;Both;20 reps;Seated   PT Diagnosis:    PT Problem List:   PT Treatment Interventions:     PT Goals (current goals can now be found in the care plan section)    Visit Information  Last PT Received On: 10/11/12 Assistance Needed: +1 History of Present Illness: 79 year old male patient with known history of COPD recently treated with Levaquin for pneumonia. He also has atrial fibrillation and is on chronic Pradaxa. He was sent to the ER for intermittent episodes of lethargy and confusion. He was initially sent to his primary care physician's office where he was noted to be hypoxic    Subjective Data      Cognition  Cognition Arousal/Alertness: Awake/alert Behavior During Therapy: WFL for tasks assessed/performed Overall Cognitive Status: Impaired/Different from baseline Area of Impairment: Safety/judgement Safety/Judgement: Decreased awareness of deficits    Balance     End of Session PT - End of Session Equipment Utilized During Treatment: Gait belt Activity Tolerance: Patient limited by fatigue Patient left: in chair;with call bell/phone within reach   GP     Delorse Lek 10/11/2012, 10:16 AM Vernell Leep  Pam Drown, [place]

## 2012-10-12 DIAGNOSIS — R319 Hematuria, unspecified: Secondary | ICD-10-CM

## 2012-10-12 LAB — BASIC METABOLIC PANEL
BUN: 29 mg/dL — ABNORMAL HIGH (ref 6–23)
Calcium: 8.5 mg/dL (ref 8.4–10.5)
Chloride: 100 mEq/L (ref 96–112)
Creatinine, Ser: 1.09 mg/dL (ref 0.50–1.35)
GFR calc non Af Amer: 58 mL/min — ABNORMAL LOW (ref >90)
Glucose, Bld: 96 mg/dL (ref 70–99)
Sodium: 139 mEq/L (ref 135–145)

## 2012-10-12 MED ORDER — LEVOFLOXACIN 500 MG PO TABS: 500.0000 mg | ORAL_TABLET | Freq: Every day | ORAL | Status: DC

## 2012-10-12 MED ORDER — PREDNISONE 1 MG PO TABS: 3.0000 mg | ORAL_TABLET | Freq: Every day | ORAL | Status: DC

## 2012-10-12 MED ORDER — FUROSEMIDE 20 MG PO TABS: 20.0000 mg | ORAL_TABLET | ORAL | Status: DC

## 2012-10-12 MED ORDER — PREDNISONE 10 MG PO TABS: ORAL_TABLET | ORAL | Status: DC

## 2012-10-12 NOTE — Discharge Summary (Signed)
Physician Discharge Summary  Christopher Buchanan XBM:[id number] DOB: 02-13-1920 DOA: 10/07/2012  PCP: No primary provider on file.  Admit date: 10/07/2012 Discharge date: 10/12/2012  Time spent: 35 minutes  Recommendations for Outpatient Follow-up:  1. Follow up with PCP check weight and b-met. Titrate diuretics as needed. 2. Estimated appropriate weight 94.0 kg.  Filed Weights   10/10/12 0731 10/11/12 0421 10/12/12 0620  Weight: 92.2 kg (203 lb 4.2 oz) 92.9 kg (204 lb 12.9 oz) 93.6 kg (206 lb 5.6 oz)     Discharge Diagnoses:  Active Problems:   Acute respiratory failure with hypoxia   COPD exacerbation   HTN (hypertension)   Pacemaker   Hematuria   Hyponatremia   Hyperkalemia   Hypercapnia   Discharge Condition: stable  Diet recommendation: heart healthy diet  History of present illness:  79 y.o. male with hx of COPD, recent PNA on Levoquin, s/p PPM, afib on betablocker and Pradaxa, brought to the ER as he was having intermittent lethargy and confusion. PCP also noted he was hypoxic. He still has some nonproductive coughs, but no chest pain. There has been no fever or chills. In the ER, he was found to have a CXR with only mild vascular congestion. His head CT showed no acute processes. In the ER, as supplemental oxygen in the form of nasal canula was given to him, he became even more lethargic, ABG 7.26/79/paO2=94. When I then saw him, he was alert and was able to converse meaningfully. He denied being short of breath or having any chest pain. His WBC was 11K, and his renal fx tests were normal with K of 5.2 mEQ per Liter. Hospitalist was asked to admit him for these symptoms. When he was in the ER, foley catheter was placed, and there were hematuria presumably from insertion trauma.   Hospital Course:  Acute respiratory failure with hypoxia and hypercapnia due to acute COPD exacerbation  - On admission was started on steroids and IV antibiotics, dyspnea improved. - Cont Levaquin to  cover for PNA/bronchitis for 3 additional days. - Start steroids IV transition to orals tapared, then back to home dose. Cont inhalers.  - Will go home on 3 L O2.  Chronic Systolic RHF/Mild pulmonary HTN:  - ECHO 10.3.2014: this admit reveals RV moderately dilated with mild to moderate systolic dysfunction.  - BP has improved, restart lasix every other days. - follow up with PCP.  Encephalopathy - idiopathic  - MS has improved overall, ? due to Hypercarbia.  - B12 1735 , folate >20.0, ammonia 34  - Urine Cx negative - CT head at admit was unrevealing, w/ exception to advanced small vessel disease.   Hyperkalemia / Hyponatremia / DH  -likely due to Dehydration originally - improving  -Resume metoprolol.   Hematuria  - Suspect due to foley trauma.  - Ucx negative.   Atrial fibrillation  -rate controlled  -cont Pradaxa    Procedures:  CT head  Consultations:  none  Discharge Exam: Filed Vitals:   10/12/12 0620  BP: 106/65  Pulse: 60  Temp: 97.7 F (36.5 C)  Resp: 18    General: A&O x3 Cardiovascular: RRR Respiratory: good air movement CTA B/L  Discharge Instructions  Discharge Orders   Future Appointments Provider Department Dept Phone   10/28/2012 12:00 PM Lars Masson, MD [facility] Office [phone]   Future Orders Complete By Expires   Diet - low sodium heart healthy  As directed    Face-to-face encounter (required for  Medicare/Medicaid patients)  As directed    Comments:     I David Stall, Zaccheaus Storlie certify that this patient is under my care and that I, or a nurse practitioner or physician's assistant working with me, had a face-to-face encounter that meets the physician face-to-face encounter requirements with this patient on 10/12/2012. The encounter with the patient was in whole, or in part for the following medical condition(s) which is the primary reason for home health care (List medical condition): Acute COPD.   Questions:     The  encounter with the patient was in whole, or in part, for the following medical condition, which is the primary reason for home health care:  acute core pulmonary   I certify that, based on my findings, the following services are medically necessary home health services:  Physical therapy   My clinical findings support the need for the above services:  Pain interferes with ambulation/mobility   Further, I certify that my clinical findings support that this patient is homebound due to:  Unsafe ambulation due to balance issues   Reason for Medically Necessary Home Health Services:  Therapy- Investment banker, operational, Patent examiner   Increase activity slowly  As directed        Medication List    STOP taking these medications       azithromycin 250 MG tablet  Commonly known as:  ZITHROMAX      TAKE these medications       furosemide 20 MG tablet  Commonly known as:  LASIX  Take 1 tablet (20 mg total) by mouth every other day.     lansoprazole 30 MG capsule  Commonly known as:  PREVACID  Take 30 mg by mouth daily.     levofloxacin 500 MG tablet  Commonly known as:  LEVAQUIN  Take 1 tablet (500 mg total) by mouth daily. For 10 days.     levothyroxine 200 MCG tablet  Commonly known as:  SYNTHROID, LEVOTHROID  Take 200 mcg by mouth daily before breakfast.     metoprolol tartrate 25 MG tablet  Commonly known as:  LOPRESSOR  Take 25 mg by mouth 2 (two) times daily.     multivitamin with minerals tablet  Take 1 tablet by mouth daily.     PRADAXA 150 MG Caps capsule  Generic drug:  dabigatran  Take 150 mg by mouth 2 (two) times daily.     pravastatin 20 MG tablet  Commonly known as:  PRAVACHOL  Take 20 mg by mouth daily.     predniSONE 10 MG tablet  Commonly known as:  DELTASONE  Take 3 tablets for 1 days, then 2 tabs for 1 days, then 1 tab for 1 days, and then stop.     predniSONE 1 MG tablet  Commonly known as:  DELTASONE  Take 3 tablets (3 mg total) by mouth  daily.  Start taking on:  10/16/2012     REQUIP XL 6 MG Tb24  Generic drug:  Ropinirole HCl  Take 6 mg by mouth daily.     traMADol 50 MG tablet  Commonly known as:  ULTRAM  Take 50 mg by mouth every 8 (eight) hours as needed for pain.       No Known Allergies     Follow-up Information   Follow up with [facility] [address] On 10/28/2012. (@ Noon with Dr. Delton See)    Specialty:  Cardiology   Contact information:   [address], [address]  [place] [address] [phone]       The results of significant diagnostics from this hospitalization (including imaging, microbiology, ancillary and laboratory) are listed below for reference.    Significant Diagnostic Studies: Dg Chest 2 View  10/07/2012   CLINICAL DATA:  Shortness of breath  EXAM: CHEST  2 VIEW  COMPARISON:  10/04/2012  FINDINGS: Cardiac shadow remains enlarged. A pacing device is again seen. Mild vascular congestion is again noted. No focal confluent infiltrate is seen. Small effusions are noted. Findings of prior vertebral augmentation are seen. Chronic thoracic compression deformities are noted.  IMPRESSION: Mild vascular congestion stable from the previous exam. No acute abnormality is noted.   Electronically Signed   By: Alcide Clever   On: 10/07/2012 16:59   Ct Head Wo Contrast  10/07/2012   *RADIOLOGY REPORT*  Clinical Data: Severe headache, altered mental status  CT HEAD WITHOUT CONTRAST  Technique:  Contiguous axial images were obtained from the base of the skull through the vertex without contrast.  Comparison: None.  Findings: No acute intracranial hemorrhage, acute infarction, mass lesion, mass effect, midline shift or hydrocephalus.  Gray-white differentiation is preserved throughout.  Global cerebral and cerebellar atrophy with compensatory ex vacuo dilatation of the lateral ventricles.  Confluent periventricular and scattered deep and subcortical white matter hypoattenuation most consistent with the  sequela of longstanding microvascular ischemia. Atherosclerotic calcifications noted in the bilateral cavernous carotid arteries.  No acute soft tissue or calvarial abnormality. Globes and orbits are intact and unremarkable bilaterally.  Normal aeration of the mastoid air cells and paranasal sinuses.  IMPRESSION:  1.  No acute intracranial abnormality. 2.  Atrophy and advanced microvascular ischemic white matter disease.   Original Report Authenticated By: Malachy Moan, M.D.    Microbiology: Recent Results (from the past 240 hour(s))  CULTURE, BLOOD (ROUTINE X 2)     Status: None   Collection Time    10/07/12  6:24 PM      Result Value Range Status   Specimen Description BLOOD HAND RIGHT   Final   Special Requests BOTTLES DRAWN AEROBIC ONLY 3CC   Final   Culture  Setup Time     Final   Value: 10/08/2012 05:49     Performed at [facility]   Culture     Final   Value:        BLOOD CULTURE RECEIVED NO GROWTH TO DATE CULTURE WILL BE HELD FOR 5 DAYS BEFORE ISSUING A FINAL NEGATIVE REPORT     Performed at [facility]   Report Status PENDING   Incomplete  MRSA PCR SCREENING     Status: Abnormal   Collection Time    10/07/12 11:26 PM      Result Value Range Status   MRSA by PCR POSITIVE (*) NEGATIVE Final   Comment:            The GeneXpert MRSA Assay (FDA     approved for NASAL specimens     only), is one component of a     comprehensive MRSA colonization     surveillance program. It is not     intended to diagnose MRSA     infection nor to guide or     monitor treatment for     MRSA infections.     RESULT CALLED TO, READ BACK BY AND VERIFIED WITH:     Rocco Pauls RN [phone] [id number] GREEN R  URINE CULTURE     Status: None  Collection Time    10/08/12  3:30 PM      Result Value Range Status   Specimen Description URINE, CATHETERIZED   Final   Special Requests NONE   Final   Culture  Setup Time     Final   Value: 10/08/2012 16:56     Performed at [facility]   Colony Count     Final   Value: NO GROWTH     Performed at [facility]   Culture     Final   Value: NO GROWTH     Performed at [facility]   Report Status 10/10/2012 FINAL   Final     Labs: Basic Metabolic Panel:  Recent Labs Lab 10/07/12 1847 10/09/12 0455 10/12/12 0510  NA 133* 132* 139  K 5.2* 4.2 4.4  CL 97 93* 100  CO2 26 33* 35*  GLUCOSE 99 87 96  BUN 21 26* 29*  CREATININE 0.90 1.31 1.09  CALCIUM 8.9 8.7 8.5   Liver Function Tests: No results found for this basename: AST, ALT, ALKPHOS, BILITOT, PROT, ALBUMIN,  in the last 168 hours No results found for this basename: LIPASE, AMYLASE,  in the last 168 hours  Recent Labs Lab 10/09/12 1405  AMMONIA 34   CBC:  Recent Labs Lab 10/07/12 1612 10/09/12 0455 10/10/12 0418  WBC 11.1* 13.1* 11.4*  NEUTROABS 7.5  --   --   HGB 10.6* 10.0* 9.2*  HCT 33.2* 32.8* 29.9*  MCV 92.2 94.5 93.7  PLT 244 232 218   Cardiac Enzymes:  Recent Labs Lab 10/07/12 2305 10/08/12 0500 10/08/12 1057  TROPONINI <0.30 <0.30 <0.30   BNP: BNP (last 3 results) No results found for this basename: PROBNP,  in the last 8760 hours CBG: No results found for this basename: GLUCAP,  in the last 168 hours     Signed:  Marinda Elk  [facility] 10/12/2012, 7:37 AM

## 2012-10-12 NOTE — Progress Notes (Addendum)
DC IV, DC Tele, DC Home. Discharge instructions and home medications discussed with patient, patient's wife and family. Patient, wife, and family denied any questions or concerns at this time. Patient leaving unit via wheelchair with O2 and appears in no acute distress.

## 2012-10-12 NOTE — Progress Notes (Signed)
Respiratory therapy note- oxygen turned down to 1l/min [place].

## 2012-10-12 NOTE — Progress Notes (Signed)
10/11/12 1330 In to speak with pt. about home health services.  Pt. states he has had [facility] in past and was satisfied with their services.  Will obtain orders. Tera Mater, RN, BSN NCM [phone]

## 2012-10-12 NOTE — Progress Notes (Signed)
SATURATION QUALIFICATIONS: (This note is used to comply with regulatory documentation for home oxygen)  Patient Saturations on 1L at Rest = 81%   O2 Sats at rest on 3l is 97-98%  Before discharge.

## 2012-10-12 NOTE — Progress Notes (Signed)
Referral received for SNF. Chart reviewed and CSW has spoken with RNCM who indicates that patient is for DC to home with Home Health and DME.  CSW to sign off. DC home today. RNCM aware and completed d/c arrangements.    Lorri Frederick. [place]  [phone]

## 2012-10-12 NOTE — Progress Notes (Signed)
10/12/12 1030 TC to [place], with [facility] to give orders for [facility] PT/OT/RN.  Pt. to dc home.  Noted new DME orders for oxygen.  TC to [facility] with [facility], to give referral. Tera Mater, RN, BSN NCM [phone]

## 2012-10-14 LAB — CULTURE, BLOOD (ROUTINE X 2): Culture: NO GROWTH

## 2012-10-27 ENCOUNTER — Encounter: Payer: Self-pay | Admitting: *Deleted

## 2012-10-28 ENCOUNTER — Other Ambulatory Visit: Payer: Self-pay

## 2012-10-28 ENCOUNTER — Ambulatory Visit (INDEPENDENT_AMBULATORY_CARE_PROVIDER_SITE_OTHER): Payer: Medicare Other | Admitting: Cardiology

## 2012-10-28 ENCOUNTER — Encounter: Payer: Self-pay | Admitting: Cardiology

## 2012-10-28 VITALS — BP 124/80 | HR 68 | Ht 73.0 in | Wt 211.0 lb

## 2012-10-28 DIAGNOSIS — I10 Essential (primary) hypertension: Secondary | ICD-10-CM

## 2012-10-28 DIAGNOSIS — E785 Hyperlipidemia, unspecified: Secondary | ICD-10-CM

## 2012-10-28 MED ORDER — METOPROLOL TARTRATE 25 MG PO TABS: 25.0000 mg | ORAL_TABLET | Freq: Two times a day (BID) | ORAL | Status: DC

## 2012-10-28 NOTE — Progress Notes (Signed)
Patient ID: Christopher Buchanan, male   DOB: 1920/03/05, 79 y.o.   MRN: [id number]    Patient Name: Christopher Buchanan Date of Encounter: 10/28/2012  Primary Care Provider:  No primary provider on file. Primary Cardiologist:  Tobias Alexander, H  Patient Profile  Posthospitalization visit for COPD  Problem List   Past Medical History  Diagnosis Date  . GERD (gastroesophageal reflux disease)   . Pacemaker medtronic single chamber  . Acute respiratory failure with hypoxia 10/07/2012  . COPD exacerbation 10/07/2012  . Hematuria 10/08/2012  . Hypercapnia 10/08/2012  . Hyperkalemia 10/08/2012  . Hyponatremia 10/08/2012  . HTN (hypertension) 10/07/2012   Past Surgical History  Procedure Laterality Date  . Pacemaker insertion      Allergies  No Known Allergies  HPI  A very pleasant 79 year old gentleman who recently moved to the area and he is coming to establish cardiology care. The patient has a history of heart block unknown degree, for which he received a pacemaker 12 years ago with a generator change approximately one year ago. The patient also has a history of paroxysmal atrial fibrillation, on anticoagulation therapy with Pradaxa. His pacemaker was checked approximately one month ago and was functioning normally. The patient has no history of coronary artery disease, he denies any chest pain, the patient is a former smoker quit 20 years ago. He was recently hospitalized with worsening shortness of breath and was diagnosed with pneumonia for which he is being treated currently. He was sent home on home O2 therapy that he doesn't need any more. The patient denies palpitations or syncopal episode. Patient's wife mentions that occasionally he'll have lower extremity edema for which they purchased at a low to allow him to have his legs in horizontal position. This significantly improved his lower extremity edema.  Home Medications  Prior to Admission medications   Medication Sig Start Date End Date  Taking? Authorizing Provider  dabigatran (PRADAXA) 150 MG CAPS capsule Take 150 mg by mouth 2 (two) times daily.   Yes Historical Provider, MD  furosemide (LASIX) 20 MG tablet Take 1 tablet (20 mg total) by mouth every other day. 10/12/12  Yes Marinda Elk, MD  lansoprazole (PREVACID) 30 MG capsule Take 30 mg by mouth daily.   Yes Historical Provider, MD  levofloxacin (LEVAQUIN) 500 MG tablet Take 1 tablet (500 mg total) by mouth daily. For 10 days. 10/12/12  Yes Marinda Elk, MD  levothyroxine (SYNTHROID, LEVOTHROID) 200 MCG tablet Take 200 mcg by mouth daily before breakfast.   Yes Historical Provider, MD  metoprolol tartrate (LOPRESSOR) 25 MG tablet Take 25 mg by mouth 2 (two) times daily.   Yes Historical Provider, MD  Multiple Vitamins-Minerals (MULTIVITAMIN WITH MINERALS) tablet Take 1 tablet by mouth daily.   Yes Historical Provider, MD  pravastatin (PRAVACHOL) 20 MG tablet Take 20 mg by mouth daily.   Yes Historical Provider, MD  predniSONE (DELTASONE) 1 MG tablet Take 3 tablets (3 mg total) by mouth daily. 10/16/12  Yes Marinda Elk, MD  Ropinirole HCl (REQUIP XL) 6 MG TB24 Take 6 mg by mouth daily.   Yes Historical Provider, MD  traMADol (ULTRAM) 50 MG tablet Take 50 mg by mouth every 8 (eight) hours as needed for pain.   Yes Historical Provider, MD    Family History  History reviewed. No pertinent family history.  Social History  History   Social History  . Marital Status: Married    Spouse Name: N/A    Number of  Children: N/A  . Years of Education: N/A   Occupational History  . Not on file.   Social History Main Topics  . Smoking status: Never Smoker   . Smokeless tobacco: Not on file  . Alcohol Use: No  . Drug Use: No  . Sexual Activity: Not on file   Other Topics Concern  . Not on file   Social History Narrative  . No narrative on file     Review of Systems as per history of present illness General:  No chills, fever, night sweats or  weight changes.  Cardiovascular:  No chest pain, dyspnea on exertion, edema, orthopnea, palpitations, paroxysmal nocturnal dyspnea. Dermatological: No rash, lesions/masses Respiratory: No cough, dyspnea Urologic: No hematuria, dysuria Abdominal:   No nausea, vomiting, diarrhea, bright red blood per rectum, melena, or hematemesis Neurologic:  No visual changes, wkns, changes in mental status. All other systems reviewed and are otherwise negative except as noted above.  Physical Exam  Blood pressure 124/80, pulse 68, height 6\' 1"  (1.854 m), weight 211 lb (95.709 kg), SpO2 93.00%.  General: Pleasant, NAD Psych: Normal affect. Neuro: Alert and oriented X 3. Moves all extremities spontaneously. HEENT: Normal  Neck: Supple without bruits or JVD. Lungs:  Resp regular and unlabored, minimal rales at the right bases. Heart: RRR no s3, s4, or murmurs. Abdomen: Soft, non-tender, non-distended, BS + x 4.  Extremities: No clubbing, cyanosis, mild non-pitting LE edema. DP/PT/Radials 2+ and equal bilaterally.  Accessory Clinical Findings  ECG - ventricular pacing with ventricular rate 68 beats per minute  TTE 10/08/2012 Study Conclusions  - Left ventricle: The cavity size was normal. There was moderate concentric hypertrophy. Systolic function was normal. Wall motion was normal; there were no regional wall motion abnormalities. - Ventricular septum: Septal motion showed paradox. These changes are consistent with a left bundle branch block. - Left atrium: The atrium was moderately dilated. - Right ventricle: The cavity size was moderately dilated. Wall thickness was normal. Systolic function was mildly to moderately reduced. - Right atrium: The atrium was moderately to severely dilated. - Tricuspid valve: Moderate regurgitation. - Pulmonary arteries: PA peak pressure: 54mm Hg (S). Impressions:  - The right ventricular systolic pressure was increased consistent with mild pulmonary  hypertension.  Lipid Panel  No results found for this basename: chol, trig, hdl, cholhdl, vldl, ldlcalc    Assessment & Plan  Very pleasant 79 year old gentleman who is coming to establish care  1. Heart block status post pacemaker placement, based on EKG pacemaker is functioning properly, it was last checked about 2 months ago. He will schedule him at our pacemaker clinic to establish followup.  2. Right-sided heart failure possibly due to COPD/ recent pneumonia, the patient has dilated right ventricle with mildly dysfunctional right ventricle. On physical exam patient has minimal fluid overload. The patient was advised to use furosemide 20 mg by mouth daily instead of every other day for one week and then continue with previous regimen every other day.  3. Hypertension, controlled, continue current regimen.  4. Hyperlipidemia- on the lipid profile, check by his primary care physician currently on pravastatin  Followup in 6 months with lipid profile and comprehensive metabolic profile checked at that time.  Tobias Alexander, Rexene Edison, MD 10/28/2012, 12:34 PM

## 2012-10-28 NOTE — Patient Instructions (Addendum)
Your physician recommends that you continue on your current medications as directed. Please refer to the Current Medication list given to you today.  Your physician recommends that you return for lab work in: 6 months (at your next office visit with Dr Delton See). Please do not eat or drink after midnight the night before labs are drawn although you may drink enough water to take your morning medications.  Your physician is referring you to our device clinic    Your physician wants you to follow-up in: 6 months. You will receive a reminder letter in the mail two months in advance. If you don't receive a letter, please call our office to schedule the follow-up appointment.

## 2012-11-04 ENCOUNTER — Other Ambulatory Visit: Payer: Self-pay

## 2012-11-04 ENCOUNTER — Telehealth: Payer: Self-pay | Admitting: Cardiology

## 2012-11-04 MED ORDER — PRAVASTATIN SODIUM 20 MG PO TABS: 20.0000 mg | ORAL_TABLET | Freq: Every day | ORAL | Status: DC

## 2012-11-04 NOTE — Telephone Encounter (Signed)
New message     Need pravastatin (new presc) called in to [facility] [phone]-- This is a new presc. Pt transferred care from their doc in [place] and got all medications transferred except this one.

## 2012-12-15 ENCOUNTER — Ambulatory Visit (INDEPENDENT_AMBULATORY_CARE_PROVIDER_SITE_OTHER): Payer: Medicare Other | Admitting: *Deleted

## 2012-12-15 DIAGNOSIS — I4891 Unspecified atrial fibrillation: Secondary | ICD-10-CM

## 2012-12-15 LAB — MDC_IDC_ENUM_SESS_TYPE_INCLINIC
Battery Impedance: 304 Ohm
Brady Statistic RV Percent Paced: 82 %
Date Time Interrogation Session: 20141210154518
Lead Channel Impedance Value: 671 Ohm
Lead Channel Pacing Threshold Pulse Width: 0.4 ms
Lead Channel Sensing Intrinsic Amplitude: 11.2 mV
Lead Channel Setting Pacing Amplitude: 2.5 V
Lead Channel Setting Sensing Sensitivity: 2.8 mV

## 2012-12-15 NOTE — Progress Notes (Signed)
  Pacemaker check in clinic. Normal device function. Thresholds, sensing, impedances consistent with previous measurements. Device programmed to maximize longevity. No mode switch or high ventricular rates noted. Device programmed at appropriate safety margins. Histogram distribution appropriate for patient activity level. Device programmed to optimize intrinsic conduction. Estimated longevity 8. Patient education completed.  ROV in April with Dr. Ladona Ridgel.

## 2013-01-03 ENCOUNTER — Encounter: Payer: Self-pay | Admitting: Internal Medicine

## 2013-02-04 ENCOUNTER — Telehealth: Payer: Self-pay | Admitting: Cardiology

## 2013-02-04 NOTE — Telephone Encounter (Signed)
Heather, [facility], states that the pt ran out of his Metoprolol on Wednesday morning and did not take on Wednesday night or Thursday morning and that she checked his BP on Thursday (yesterday) and it was low at 109/66 with a pulse of 62. She wants to know if his meds need to be adjusted. She states that they have picked up his Metoprolol and pt has resumed taking. Herbert SetaHeather is advised to continue to monitor the pts BP and to call us back if BP remains low, she verbalized understanding.

## 2013-02-04 NOTE — Telephone Encounter (Signed)
New message  Herbert SetaHeather w/[facility] wanted you to be aware that patient is not taking BP meds. Please call and advise.

## 2013-03-02 ENCOUNTER — Ambulatory Visit: Payer: Medicare Other | Admitting: Physical Therapy

## 2013-04-21 ENCOUNTER — Encounter: Payer: Self-pay | Admitting: *Deleted

## 2013-04-28 ENCOUNTER — Ambulatory Visit (INDEPENDENT_AMBULATORY_CARE_PROVIDER_SITE_OTHER): Payer: Commercial Managed Care - HMO | Admitting: Cardiology

## 2013-04-28 ENCOUNTER — Encounter: Payer: Self-pay | Admitting: Cardiology

## 2013-04-28 VITALS — BP 107/60 | HR 85 | Ht 73.0 in | Wt 198.0 lb

## 2013-04-28 DIAGNOSIS — Z950 Presence of cardiac pacemaker: Secondary | ICD-10-CM

## 2013-04-28 DIAGNOSIS — I10 Essential (primary) hypertension: Secondary | ICD-10-CM

## 2013-04-28 DIAGNOSIS — E871 Hypo-osmolality and hyponatremia: Secondary | ICD-10-CM

## 2013-04-28 DIAGNOSIS — E875 Hyperkalemia: Secondary | ICD-10-CM

## 2013-04-28 LAB — BASIC METABOLIC PANEL
BUN: 26 mg/dL — ABNORMAL HIGH (ref 6–23)
CO2: 26 mEq/L (ref 19–32)
Calcium: 9.5 mg/dL (ref 8.4–10.5)
Chloride: 105 mEq/L (ref 96–112)
Creatinine, Ser: 1.3 mg/dL (ref 0.4–1.5)
GFR: 54.54 mL/min — ABNORMAL LOW (ref >60.00)
Glucose, Bld: 112 mg/dL — ABNORMAL HIGH (ref 70–99)
Potassium: 4.4 mEq/L (ref 3.5–5.1)
Sodium: 137 mEq/L (ref 135–145)

## 2013-04-28 NOTE — Patient Instructions (Signed)
Your physician wants you to follow-up in: IN 6 MONTHS WITH DR Johnell ComingsNELSON You will receive a reminder letter in the mail two months in advance. If you don't receive a letter, please call our office to schedule the follow-up appointment.  WE WILL SCHEDULE YOU AN APPOINTMENT FOR A PACEMAKER CHECK  Your physician recommends that you return for lab work in: TODAY Designer, jewellery(BMET)

## 2013-04-28 NOTE — Progress Notes (Signed)
Patient ID: Christopher BerkshireRobert Connell, male   DOB: Oct 11, 1920, 80 y.o.   MRN: [id number]    Patient Name: Christopher BerkshireRobert Reindel Date of Encounter: 04/28/2013  Primary Care Provider:  No primary provider on file. Primary Cardiologist:  Lars MassonKatarina H Ormand Senn  Patient Profile  Posthospitalization visit for COPD  Problem List   Past Medical History  Diagnosis Date  . GERD (gastroesophageal reflux disease)   . Pacemaker medtronic single chamber  . Acute respiratory failure with hypoxia 10/07/2012  . COPD exacerbation 10/07/2012  . Hematuria 10/08/2012  . Hypercapnia 10/08/2012  . Hyperkalemia 10/08/2012  . Hyponatremia 10/08/2012  . HTN (hypertension) 10/07/2012   Past Surgical History  Procedure Laterality Date  . Pacemaker insertion      Allergies  No Known Allergies  HPI  A very pleasant 80 year old gentleman who recently moved to the area and he is coming to establish cardiology care. The patient has a history of heart block unknown degree, for which he received a pacemaker 12 years ago with a generator change approximately one year ago. The patient also has a history of paroxysmal atrial fibrillation, on anticoagulation therapy with Pradaxa. His pacemaker was checked approximately one month ago and was functioning normally. The patient has no history of coronary artery disease, he denies any chest pain, the patient is a former smoker quit 20 years ago. He was recently hospitalized with worsening shortness of breath and was diagnosed with pneumonia for which he is being treated currently. He was sent home on home O2 therapy that he doesn't need any more. The patient denies palpitations or syncopal episode. Patient's wife mentions that occasionally he'll have lower extremity edema for which they purchased at a low to allow him to have his legs in horizontal position. This significantly improved his lower extremity edema.  The patientis coming after 6 months, he is asymptomatic, no SOB, CP or LE edema. No  dizziness, orthopnea or palpitations. He is mourning a loss of his wife of 70 years that passed just a week ago due to lung cancer.  Home Medications  Prior to Admission medications   Medication Sig Start Date End Date Taking? Authorizing Provider  dabigatran (PRADAXA) 150 MG CAPS capsule Take 150 mg by mouth 2 (two) times daily.   Yes Historical Provider, MD  furosemide (LASIX) 20 MG tablet Take 1 tablet (20 mg total) by mouth every other day. 10/12/12  Yes Marinda ElkAbraham Feliz Ortiz, MD  lansoprazole (PREVACID) 30 MG capsule Take 30 mg by mouth daily.   Yes Historical Provider, MD  levofloxacin (LEVAQUIN) 500 MG tablet Take 1 tablet (500 mg total) by mouth daily. For 10 days. 10/12/12  Yes Marinda ElkAbraham Feliz Ortiz, MD  levothyroxine (SYNTHROID, LEVOTHROID) 200 MCG tablet Take 200 mcg by mouth daily before breakfast.   Yes Historical Provider, MD  metoprolol tartrate (LOPRESSOR) 25 MG tablet Take 25 mg by mouth 2 (two) times daily.   Yes Historical Provider, MD  Multiple Vitamins-Minerals (MULTIVITAMIN WITH MINERALS) tablet Take 1 tablet by mouth daily.   Yes Historical Provider, MD  pravastatin (PRAVACHOL) 20 MG tablet Take 20 mg by mouth daily.   Yes Historical Provider, MD  predniSONE (DELTASONE) 1 MG tablet Take 3 tablets (3 mg total) by mouth daily. 10/16/12  Yes Marinda ElkAbraham Feliz Ortiz, MD  Ropinirole HCl (REQUIP XL) 6 MG TB24 Take 6 mg by mouth daily.   Yes Historical Provider, MD  traMADol (ULTRAM) 50 MG tablet Take 50 mg by mouth every 8 (eight) hours as needed for pain.  Yes Historical Provider, MD    Family History  No family history on file.  Social History  History   Social History  . Marital Status: Married    Spouse Name: N/A    Number of Children: N/A  . Years of Education: N/A   Occupational History  . Not on file.   Social History Main Topics  . Smoking status: Never Smoker   . Smokeless tobacco: Not on file  . Alcohol Use: No  . Drug Use: No  . Sexual Activity: Not on file    Other Topics Concern  . Not on file   Social History Narrative  . No narrative on file     Review of Systems as per history of present illness General:  No chills, fever, night sweats or weight changes.  Cardiovascular:  No chest pain, dyspnea on exertion, edema, orthopnea, palpitations, paroxysmal nocturnal dyspnea. Dermatological: No rash, lesions/masses Respiratory: No cough, dyspnea Urologic: No hematuria, dysuria Abdominal:   No nausea, vomiting, diarrhea, bright red blood per rectum, melena, or hematemesis Neurologic:  No visual changes, wkns, changes in mental status. All other systems reviewed and are otherwise negative except as noted above.  Physical Exam  Blood pressure 107/60, pulse 85, height 6\' 1"  (1.854 m), weight 198 lb (89.812 kg).  General: Pleasant, NAD Psych: Normal affect. Neuro: Alert and oriented X 3. Moves all extremities spontaneously. HEENT: Normal  Neck: Supple without bruits or JVD. Lungs:  Resp regular and unlabored, minimal rales at the right bases. Heart: RRR no s3, s4, or murmurs. Abdomen: Soft, non-tender, non-distended, BS + x 4.  Extremities: No clubbing, cyanosis, mild non-pitting LE edema. DP/PT/Radials 2+ and equal bilaterally.  Accessory Clinical Findings  ECG - ventricular pacing with ventricular rate 68 beats per minute  TTE 10/08/2012 Study Conclusions  - Left ventricle: The cavity size was normal. There was moderate concentric hypertrophy. Systolic function was normal. Wall motion was normal; there were no regional wall motion abnormalities. - Ventricular septum: Septal motion showed paradox. These changes are consistent with a left bundle branch block. - Left atrium: The atrium was moderately dilated. - Right ventricle: The cavity size was moderately dilated. Wall thickness was normal. Systolic function was mildly to moderately reduced. - Right atrium: The atrium was moderately to severely dilated. - Tricuspid valve:  Moderate regurgitation. - Pulmonary arteries: PA peak pressure: 54mm Hg (S). Impressions:  - The right ventricular systolic pressure was increased consistent with mild pulmonary hypertension.  Lipid Panel  No results found for this basename: chol,  trig,  hdl,  cholhdl,  vldl,  ldlcalc    Assessment & Plan  Very pleasant 80 year old gentleman who is coming to establish care  1. Heart block status post pacemaker placement, based on EKG pacemaker is functioning properly, it was last checked about 2 months ago. Seen at PM clinic in December.   2. Right-sided heart failure possibly due to COPD/ recent pneumonia in 10/2012, now euvolemic, we will continue the same regimen of Lasix 20 mg po every other day. BMP today.   3. Hypertension, controlled, almost too low for his age, but asymptomatic, advised to cut metoprolol in half if feeling weak or dizzy,  4. Hyperlipidemia- on the lipid profile, check by his primary care physician currently on pravastatin  Followup in 6 months with BMP today.   Lars MassonKatarina H Marcellene Shivley, MD 04/28/2013, 12:10 PM

## 2013-05-19 ENCOUNTER — Other Ambulatory Visit: Payer: Self-pay

## 2013-05-23 ENCOUNTER — Encounter: Payer: Self-pay | Admitting: Internal Medicine

## 2013-05-23 ENCOUNTER — Ambulatory Visit (INDEPENDENT_AMBULATORY_CARE_PROVIDER_SITE_OTHER): Payer: Commercial Managed Care - HMO | Admitting: Internal Medicine

## 2013-05-23 VITALS — BP 114/70 | HR 92 | Ht 73.0 in | Wt 194.0 lb

## 2013-05-23 DIAGNOSIS — I10 Essential (primary) hypertension: Secondary | ICD-10-CM

## 2013-05-23 DIAGNOSIS — Z950 Presence of cardiac pacemaker: Secondary | ICD-10-CM

## 2013-05-23 DIAGNOSIS — I4891 Unspecified atrial fibrillation: Secondary | ICD-10-CM

## 2013-05-23 NOTE — Assessment & Plan Note (Signed)
His blood pressure is well controlled. He will continue his current meds. 

## 2013-05-23 NOTE — Progress Notes (Signed)
      HPI Mr. Christopher Buchanan returns today for followup. He is a pleasant 80 yo man who looks younger than his stated age, with a h/o symptomatic bradycardia, s/p PPM insertion. In the interim, he has been stable. He denies chest pain or sob. He is fairly sedentary. He has chronic atrial fib and CHB. No Known Allergies   Current Outpatient Prescriptions  Medication Sig Dispense Refill  . dabigatran (PRADAXA) 150 MG CAPS capsule Take 150 mg by mouth 2 (two) times daily.      . furosemide (LASIX) 20 MG tablet Take 1 tablet (20 mg total) by mouth every other day.  30 tablet    . KLOR-CON M20 20 MEQ tablet every other day.      . lansoprazole (PREVACID) 30 MG capsule Take 30 mg by mouth daily.      Marland Kitchen. levothyroxine (SYNTHROID, LEVOTHROID) 200 MCG tablet Take 200 mcg by mouth daily before breakfast.      . metoprolol tartrate (LOPRESSOR) 25 MG tablet Take 1 tablet (25 mg total) by mouth 2 (two) times daily.  180 tablet  1  . Multiple Vitamins-Minerals (MULTIVITAMIN WITH MINERALS) tablet Take 1 tablet by mouth daily.      . pravastatin (PRAVACHOL) 20 MG tablet Take 1 tablet (20 mg total) by mouth daily.  30 tablet  6   No current facility-administered medications for this visit.     Past Medical History  Diagnosis Date  . GERD (gastroesophageal reflux disease)   . Pacemaker medtronic single chamber  . Acute respiratory failure with hypoxia 10/07/2012  . COPD exacerbation 10/07/2012  . Hematuria 10/08/2012  . Hypercapnia 10/08/2012  . Hyperkalemia 10/08/2012  . Hyponatremia 10/08/2012  . HTN (hypertension) 10/07/2012    ROS:   All systems reviewed and negative except as noted in the HPI.   Past Surgical History  Procedure Laterality Date  . Pacemaker insertion       No family history on file.   History   Social History  . Marital Status: Married    Spouse Name: N/A    Number of Children: N/A  . Years of Education: N/A   Occupational History  . Not on file.   Social History Main  Topics  . Smoking status: Never Smoker   . Smokeless tobacco: Not on file  . Alcohol Use: No  . Drug Use: No  . Sexual Activity: Not on file   Other Topics Concern  . Not on file   Social History Narrative  . No narrative on file     BP 114/70  Pulse 92  Ht 6\' 1"  (1.854 m)  Wt 194 lb (87.998 kg)  BMI 25.60 kg/m2  Physical Exam:  Well appearing elderly man, NAD HEENT: Unremarkable Neck:  7 cm JVD, no thyromegally Back:  No CVA tenderness Lungs:  Clear with no wheezes HEART:  Regular rate rhythm, no murmurs, no rubs, no clicks Abd:  soft, positive bowel sounds, no organomegally, no rebound, no guarding Ext:  2 plus pulses, no edema, no cyanosis, no clubbing Skin:  No rashes no nodules Neuro:  CN II through XII intact, motor grossly intact   DEVICE  Normal device function.  See PaceArt for details.   Assess/Plan:

## 2013-05-23 NOTE — Patient Instructions (Signed)
Your physician recommends that you schedule a follow-up appointment in: 6 months with the device clinic and in 12 months with Dr.Taylor  

## 2013-05-23 NOTE — Assessment & Plan Note (Signed)
His VVIR PM is working normally. Will recheck in several months.

## 2013-05-24 ENCOUNTER — Encounter: Payer: Self-pay | Admitting: Internal Medicine

## 2013-05-24 LAB — MDC_IDC_ENUM_SESS_TYPE_INCLINIC
Battery Impedance: 402 Ohm
Battery Remaining Longevity: 91 mo
Battery Voltage: 2.78 V
Brady Statistic RV Percent Paced: 90 %
Lead Channel Pacing Threshold Amplitude: 0.5 V
Lead Channel Sensing Intrinsic Amplitude: 11.2 mV
Lead Channel Setting Pacing Amplitude: 2.5 V
Lead Channel Setting Sensing Sensitivity: 2.8 mV
MDC IDC MSMT LEADCHNL RA IMPEDANCE VALUE: 0 Ohm
MDC IDC MSMT LEADCHNL RV IMPEDANCE VALUE: 727 Ohm
MDC IDC MSMT LEADCHNL RV PACING THRESHOLD PULSEWIDTH: 0.4 ms
MDC IDC SESS DTM: 20150518162050
MDC IDC SET LEADCHNL RV PACING PULSEWIDTH: 0.4 ms

## 2013-07-14 ENCOUNTER — Other Ambulatory Visit: Payer: Self-pay

## 2013-07-14 MED ORDER — DABIGATRAN ETEXILATE MESYLATE 150 MG PO CAPS: 150.0000 mg | ORAL_CAPSULE | Freq: Two times a day (BID) | ORAL | Status: DC

## 2013-10-26 ENCOUNTER — Encounter: Payer: Self-pay | Admitting: Cardiology

## 2013-10-26 ENCOUNTER — Ambulatory Visit (INDEPENDENT_AMBULATORY_CARE_PROVIDER_SITE_OTHER): Payer: Commercial Managed Care - HMO | Admitting: Cardiology

## 2013-10-26 VITALS — BP 96/56 | HR 61 | Ht 73.0 in | Wt 198.0 lb

## 2013-10-26 DIAGNOSIS — I10 Essential (primary) hypertension: Secondary | ICD-10-CM

## 2013-10-26 DIAGNOSIS — Z950 Presence of cardiac pacemaker: Secondary | ICD-10-CM

## 2013-10-26 MED ORDER — METOPROLOL TARTRATE 12.5 MG HALF TABLET: 12.5000 mg | ORAL_TABLET | Freq: Two times a day (BID) | ORAL | Status: DC

## 2013-10-26 NOTE — Progress Notes (Signed)
Patient ID: Christopher Buchanan, male   DOB: 1920-04-15, 80 y.o.   MRN: [id number]  Patient ID: Christopher Buchanan, male   DOB: 1920-04-15, 80 y.o.   MRN: [id number]    Patient Name: Christopher Buchanan Date of Encounter: 10/26/2013  Primary Care Provider:  No primary provider on file. Primary Cardiologist:  Christopher Buchanan  Patient Profile  Posthospitalization visit for COPD  Problem List   Past Medical History  Diagnosis Date  . GERD (gastroesophageal reflux disease)   . Pacemaker medtronic single chamber  . Acute respiratory failure with hypoxia 10/07/2012  . COPD exacerbation 10/07/2012  . Hematuria 10/08/2012  . Hypercapnia 10/08/2012  . Hyperkalemia 10/08/2012  . Hyponatremia 10/08/2012  . HTN (hypertension) 10/07/2012   Past Surgical History  Procedure Laterality Date  . Pacemaker insertion      Allergies  No Known Allergies  HPI  A very pleasant 80 year old gentleman who recently moved to the area and he is coming to establish cardiology care. The patient has a history of heart block unknown degree, for which he received a pacemaker 12 years ago with a generator change approximately one year ago. The patient also has a history of paroxysmal atrial fibrillation, on anticoagulation therapy with Pradaxa. His pacemaker was checked approximately one month ago and was functioning normally. The patient has no history of coronary artery disease, he denies any chest pain, the patient is a former smoker quit 20 years ago. He was recently hospitalized with worsening shortness of breath and was diagnosed with pneumonia for which he is being treated currently. He was sent home on home O2 therapy that he doesn't need any more. The patient denies palpitations or syncopal episode. Patient's wife mentions that occasionally he'll have lower extremity edema for which they purchased at a low to allow him to have his legs in horizontal position. This significantly improved his lower extremity edema.  The patientis  coming after 6 months, he is asymptomatic, no SOB, CP or LE edema. No dizziness, orthopnea or palpitations. He is mourning a loss of his wife of 70 years that passed just a week ago due to lung cancer.  10/26/2013 - the patient is coming after 6 months, he is doing very well, he stays active, walking with a walker, helping at home. He denies any CP, SOB, palpitations. He saw Dr Ladona Ridgelaylor in May 2015 for PM check up and was functioning normally. No other complains, no LE edema, he hasn't been taking furosemide. No orthopnea, no PND.    Home Medications  Prior to Admission medications   Medication Sig Start Date End Date Taking? Authorizing Provider  dabigatran (PRADAXA) 150 MG CAPS capsule Take 150 mg by mouth 2 (two) times daily.   Yes Historical Provider, MD  furosemide (LASIX) 20 MG tablet Take 1 tablet (20 mg total) by mouth every other day. 10/12/12  Yes Marinda ElkAbraham Feliz Ortiz, MD  lansoprazole (PREVACID) 30 MG capsule Take 30 mg by mouth daily.   Yes Historical Provider, MD  levofloxacin (LEVAQUIN) 500 MG tablet Take 1 tablet (500 mg total) by mouth daily. For 10 days. 10/12/12  Yes Marinda ElkAbraham Feliz Ortiz, MD  levothyroxine (SYNTHROID, LEVOTHROID) 200 MCG tablet Take 200 mcg by mouth daily before breakfast.   Yes Historical Provider, MD  metoprolol tartrate (LOPRESSOR) 25 MG tablet Take 25 mg by mouth 2 (two) times daily.   Yes Historical Provider, MD  Multiple Vitamins-Minerals (MULTIVITAMIN WITH MINERALS) tablet Take 1 tablet by mouth daily.   Yes Historical Provider, MD  pravastatin (PRAVACHOL) 20 MG tablet Take 20 mg by mouth daily.   Yes Historical Provider, MD  predniSONE (DELTASONE) 1 MG tablet Take 3 tablets (3 mg total) by mouth daily. 10/16/12  Yes Marinda Elk, MD  Ropinirole HCl (REQUIP XL) 6 MG TB24 Take 6 mg by mouth daily.   Yes Historical Provider, MD  traMADol (ULTRAM) 50 MG tablet Take 50 mg by mouth every 8 (eight) hours as needed for pain.   Yes Historical Provider, MD     Family History  No family history on file.  Social History  History   Social History  . Marital Status: Married    Spouse Name: N/A    Number of Children: N/A  . Years of Education: N/A   Occupational History  . Not on file.   Social History Main Topics  . Smoking status: Never Smoker   . Smokeless tobacco: Not on file  . Alcohol Use: No  . Drug Use: No  . Sexual Activity: Not on file   Other Topics Concern  . Not on file   Social History Narrative  . No narrative on file     Review of Systems as per history of present illness General:  No chills, fever, night sweats or weight changes.  Cardiovascular:  No chest pain, dyspnea on exertion, edema, orthopnea, palpitations, paroxysmal nocturnal dyspnea. Dermatological: No rash, lesions/masses Respiratory: No cough, dyspnea Urologic: No hematuria, dysuria Abdominal:   No nausea, vomiting, diarrhea, bright red blood per rectum, melena, or hematemesis Neurologic:  No visual changes, wkns, changes in mental status. All other systems reviewed and are otherwise negative except as noted above.  Physical Exam  Blood pressure 96/56, pulse 61, height 6\' 1"  (1.854 m), weight 198 lb (89.812 kg).  General: Pleasant, NAD Psych: Normal affect. Neuro: Alert and oriented X 3. Moves all extremities spontaneously. HEENT: Normal  Neck: Supple without bruits or JVD. Lungs:  Resp regular and unlabored, minimal rales at the right bases. Heart: RRR no s3, s4, or murmurs. Abdomen: Soft, non-tender, non-distended, BS + x 4.  Extremities: No clubbing, cyanosis, mild non-pitting LE edema. DP/PT/Radials 2+ and equal bilaterally.  Accessory Clinical Findings  ECG - ventricular pacing with ventricular rate 68 beats per minute  TTE 10/08/2012 Study Conclusions  - Left ventricle: The cavity size was normal. There was moderate concentric hypertrophy. Systolic function was normal. Wall motion was normal; there were no regional wall  motion abnormalities. - Ventricular septum: Septal motion showed paradox. These changes are consistent with a left bundle branch block. - Left atrium: The atrium was moderately dilated. - Right ventricle: The cavity size was moderately dilated. Wall thickness was normal. Systolic function was mildly to moderately reduced. - Right atrium: The atrium was moderately to severely dilated. - Tricuspid valve: Moderate regurgitation. - Pulmonary arteries: PA peak pressure: 54mm Hg (S). Impressions:  - The right ventricular systolic pressure was increased consistent with mild pulmonary hypertension.  Lipid Panel  No results found for this basename: chol,  trig,  hdl,  cholhdl,  vldl,  ldlcalc    Assessment & Plan  Very pleasant 80 year old gentleman who is coming to establish care  1. Heart block status post pacemaker placement, based on EKG pacemaker is functioning properly, it was last checked about 2 months ago. We will schedule his next visit for the next month.  2. Buchanan/O Right-sided heart failure possibly due to COPD/ recent pneumonia in 10/2012, now euvolemic, off Lasix, we will continue just PRN.  BMP  checked at PCP in August and normal.  3. Hypertension, low for his age, decrease metoprolol to 12.5 mg po daily  4. Hyperlipidemia- on the lipid profile, check by his primary care physician currently on pravastatin  Followup in 6 months.   Christopher MassonNELSON, Nikkole Placzek H, MD 10/26/2013, 3:07 PM

## 2013-10-26 NOTE — Patient Instructions (Signed)
Your physician has recommended you make the following change in your medication:   DECREASE YOUR METOPROLOL TO 12.5 MG TWICE DAILY    YOU NEED TO SCHEDULE AN APPOINTMENT AT DEVICE CLINIC TO HAVE YOUR PACEMAKER CHECKED     Your physician wants you to follow-up in: 6 MONTHS WITH DR Johnell ComingsNELSON You will receive a reminder letter in the mail two months in advance. If you don't receive a letter, please call our office to schedule the follow-up appointment.

## 2013-11-28 ENCOUNTER — Ambulatory Visit (INDEPENDENT_AMBULATORY_CARE_PROVIDER_SITE_OTHER): Payer: Commercial Managed Care - HMO | Admitting: *Deleted

## 2013-11-28 DIAGNOSIS — I482 Chronic atrial fibrillation, unspecified: Secondary | ICD-10-CM

## 2013-11-28 LAB — MDC_IDC_ENUM_SESS_TYPE_INCLINIC
Battery Impedance: 501 Ohm
Battery Remaining Longevity: 84 mo
Battery Voltage: 2.78 V
Brady Statistic RV Percent Paced: 93 %
Lead Channel Pacing Threshold Amplitude: 0.5 V
Lead Channel Pacing Threshold Pulse Width: 0.4 ms
Lead Channel Setting Pacing Amplitude: 2.5 V
Lead Channel Setting Pacing Pulse Width: 0.4 ms
MDC IDC MSMT LEADCHNL RA IMPEDANCE VALUE: 0 Ohm
MDC IDC MSMT LEADCHNL RV IMPEDANCE VALUE: 751 Ohm
MDC IDC SESS DTM: 20151123153738
MDC IDC SET LEADCHNL RV SENSING SENSITIVITY: 2.8 mV

## 2013-11-28 NOTE — Progress Notes (Signed)
Pacemaker check in clinic. Normal device function. Thresholds, sensing, impedances consistent with previous measurements. Device programmed to maximize longevity. No high ventricular rates noted. Device programmed at appropriate safety margins. Histogram distribution appropriate for patient activity level. Device programmed to optimize intrinsic conduction. Estimated longevity7 years.  Patient education completed.  ROV 6 months with Dr. Ladona Ridgelaylor.

## 2013-12-14 ENCOUNTER — Encounter: Payer: Self-pay | Admitting: Internal Medicine

## 2014-01-14 ENCOUNTER — Other Ambulatory Visit: Payer: Self-pay | Admitting: Cardiology

## 2014-03-03 ENCOUNTER — Telehealth: Payer: Self-pay | Admitting: Cardiology

## 2014-03-03 NOTE — Telephone Encounter (Signed)
New Message       Pt's daughter calling wanting to clarify if pt is to be taking Prodaxa 2x per day, or 1x per day. Please call back and advise.

## 2014-03-03 NOTE — Telephone Encounter (Signed)
Called Patient's daughter back about his Pradaxa, patient is prescribed Pradaxa 150 mg twice daily. Informed daughter that of correct dosage and that patient should be taking twice daily. Daughter verbalized understanding and will make sure patient gets Pradaxa twice daily.

## 2014-04-25 ENCOUNTER — Ambulatory Visit: Payer: Commercial Managed Care - HMO | Admitting: Cardiology

## 2014-04-26 ENCOUNTER — Ambulatory Visit (INDEPENDENT_AMBULATORY_CARE_PROVIDER_SITE_OTHER): Payer: Medicare HMO | Admitting: Cardiology

## 2014-04-26 ENCOUNTER — Encounter: Payer: Self-pay | Admitting: Cardiology

## 2014-04-26 VITALS — BP 114/60 | HR 67 | Ht 72.0 in | Wt 197.4 lb

## 2014-04-26 DIAGNOSIS — I480 Paroxysmal atrial fibrillation: Secondary | ICD-10-CM | POA: Diagnosis not present

## 2014-04-26 DIAGNOSIS — I509 Heart failure, unspecified: Secondary | ICD-10-CM

## 2014-04-26 DIAGNOSIS — Z950 Presence of cardiac pacemaker: Secondary | ICD-10-CM

## 2014-04-26 DIAGNOSIS — I50810 Right heart failure, unspecified: Secondary | ICD-10-CM

## 2014-04-26 DIAGNOSIS — I442 Atrioventricular block, complete: Secondary | ICD-10-CM

## 2014-04-26 DIAGNOSIS — I10 Essential (primary) hypertension: Secondary | ICD-10-CM | POA: Diagnosis not present

## 2014-04-26 MED ORDER — APIXABAN 5 MG PO TABS: 5.0000 mg | ORAL_TABLET | Freq: Two times a day (BID) | ORAL | Status: DC

## 2014-04-26 NOTE — Progress Notes (Signed)
Patient ID: Murle Otting, male   DOB: 05-13-1920, 81 y.o.   MRN: [id number]    Patient Name: Christopher Buchanan Date of Encounter: 04/26/2014  Primary Care Provider:  No primary care provider on file. Primary Cardiologist:  Lars Masson  Patient Profile  Posthospitalization visit for COPD  Problem List   Past Medical History  Diagnosis Date  . GERD (gastroesophageal reflux disease)   . Pacemaker medtronic single chamber  . Acute respiratory failure with hypoxia 10/07/2012  . COPD exacerbation 10/07/2012  . Hematuria 10/08/2012  . Hypercapnia 10/08/2012  . Hyperkalemia 10/08/2012  . Hyponatremia 10/08/2012  . HTN (hypertension) 10/07/2012   Past Surgical History  Procedure Laterality Date  . Pacemaker insertion      Allergies  No Known Allergies  HPI  A very pleasant 81 year old gentleman who recently moved to the area and he is coming to establish cardiology care. The patient has a history of heart block unknown degree, for which he received a pacemaker 12 years ago with a generator change approximately one year ago. The patient also has a history of paroxysmal atrial fibrillation, on anticoagulation therapy with Pradaxa. His pacemaker was checked approximately one month ago and was functioning normally. The patient has no history of coronary artery disease, he denies any chest pain, the patient is a former smoker quit 20 years ago. He was recently hospitalized with worsening shortness of breath and was diagnosed with pneumonia for which he is being treated currently. He was sent home on home O2 therapy that he doesn't need any more. The patient denies palpitations or syncopal episode. Patient's wife mentions that occasionally he'll have lower extremity edema for which they purchased at a low to allow him to have his legs in horizontal position. This significantly improved his lower extremity edema.  The patientis coming after 6 months, he is asymptomatic, no SOB, CP or LE edema. No  dizziness, orthopnea or palpitations. He is mourning a loss of his wife of 70 years that passed just a week ago due to lung cancer.  10/26/2013 - the patient is coming after 6 months, he is doing very well, he stays active, walking with a walker, helping at home. He denies any CP, SOB, palpitations. He saw Dr Ladona Ridgel in May 2015 for PM check up and was functioning normally. No other complains, no LE edema, he hasn't been taking furosemide. No orthopnea, no PND.   04/26/14 - 6 months follow up, the patient is doing well, he has moved in with another daughter. He is still very active around the house, walks with a walker, denies any falls, no chest pain, DOR, palpitations, syncope. No falls, no bleeding while being on Pradaxa. Paying a lot for Pradaxa.   Home Medications  Prior to Admission medications   Medication Sig Start Date End Date Taking? Authorizing Provider  dabigatran (PRADAXA) 150 MG CAPS capsule Take 150 mg by mouth 2 (two) times daily.   Yes Historical Provider, MD  furosemide (LASIX) 20 MG tablet Take 1 tablet (20 mg total) by mouth every other day. 10/12/12  Yes Marinda Elk, MD  lansoprazole (PREVACID) 30 MG capsule Take 30 mg by mouth daily.   Yes Historical Provider, MD  levofloxacin (LEVAQUIN) 500 MG tablet Take 1 tablet (500 mg total) by mouth daily. For 10 days. 10/12/12  Yes Marinda Elk, MD  levothyroxine (SYNTHROID, LEVOTHROID) 200 MCG tablet Take 200 mcg by mouth daily before breakfast.   Yes Historical Provider, MD  metoprolol tartrate (LOPRESSOR)  25 MG tablet Take 25 mg by mouth 2 (two) times daily.   Yes Historical Provider, MD  Multiple Vitamins-Minerals (MULTIVITAMIN WITH MINERALS) tablet Take 1 tablet by mouth daily.   Yes Historical Provider, MD  pravastatin (PRAVACHOL) 20 MG tablet Take 20 mg by mouth daily.   Yes Historical Provider, MD  predniSONE (DELTASONE) 1 MG tablet Take 3 tablets (3 mg total) by mouth daily. 10/16/12  Yes Marinda ElkAbraham Feliz Ortiz, MD    Ropinirole HCl (REQUIP XL) 6 MG TB24 Take 6 mg by mouth daily.   Yes Historical Provider, MD  traMADol (ULTRAM) 50 MG tablet Take 50 mg by mouth every 8 (eight) hours as needed for pain.   Yes Historical Provider, MD    Family History  History reviewed. No pertinent family history.  Social History  History   Social History  . Marital Status: Married    Spouse Name: N/A  . Number of Children: N/A  . Years of Education: N/A   Occupational History  . Not on file.   Social History Main Topics  . Smoking status: Never Smoker   . Smokeless tobacco: Not on file  . Alcohol Use: No  . Drug Use: No  . Sexual Activity: Not on file   Other Topics Concern  . Not on file   Social History Narrative     Review of Systems as per history of present illness General:  No chills, fever, night sweats or weight changes.  Cardiovascular:  No chest pain, dyspnea on exertion, edema, orthopnea, palpitations, paroxysmal nocturnal dyspnea. Dermatological: No rash, lesions/masses Respiratory: No cough, dyspnea Urologic: No hematuria, dysuria Abdominal:   No nausea, vomiting, diarrhea, bright red blood per rectum, melena, or hematemesis Neurologic:  No visual changes, wkns, changes in mental status. All other systems reviewed and are otherwise negative except as noted above.  Physical Exam  Blood pressure 114/60, pulse 67, height 6' (1.829 m), weight 197 lb 6.4 oz (89.54 kg).  General: Pleasant, NAD Psych: Normal affect. Neuro: Alert and oriented X 3. Moves all extremities spontaneously. HEENT: Normal  Neck: Supple without bruits or JVD. Lungs:  Resp regular and unlabored, minimal rales at the right bases. Heart: RRR no s3, s4, or murmurs. Abdomen: Soft, non-tender, non-distended, BS + x 4.  Extremities: No clubbing, cyanosis, mild non-pitting LE edema. DP/PT/Radials 2+ and equal bilaterally.  Accessory Clinical Findings  ECG - ventricular pacing with ventricular rate 68 beats per  minute  TTE 10/08/2012 Study Conclusions  - Left ventricle: The cavity size was normal. There was moderate concentric hypertrophy. Systolic function was normal. Wall motion was normal; there were no regional wall motion abnormalities. - Ventricular septum: Septal motion showed paradox. These changes are consistent with a left bundle branch block. - Left atrium: The atrium was moderately dilated. - Right ventricle: The cavity size was moderately dilated. Wall thickness was normal. Systolic function was mildly to moderately reduced. - Right atrium: The atrium was moderately to severely dilated. - Tricuspid valve: Moderate regurgitation. - Pulmonary arteries: PA peak pressure: 54mm Hg (S). Impressions:  - The right ventricular systolic pressure was increased consistent with mild pulmonary hypertension.  Lipid Panel  No results found for: CHOL    Assessment & Plan  Very pleasant 81 year old gentleman   1. Heart block status post pacemaker placement, based on EKG pacemaker is functioning properly, it was last checked in 11/2013, scheduled for another check in May 2016.   2. H/O Right-sided heart failure possibly due to COPD/ recent pneumonia  in 10/2012, now euvolemic, off Lasix, we will continue just PRN.  BMP checked at PCP in August and normal.  3. Hypertension, low for his age, decreased metoprolol to 12.5 mg po daily, I would discontinue as he was only taking it once a day and still rather hypotensive for his age, I am concern about potential orthostatic hypotension and falls.   4. Hyperlipidemia- on the lipid profile, check by his primary care physician currently on pravastatin  5. Paroxysmal a-fib - we will discontinue Eliquis as it is a cheaper option and also has better bleeding safety profile, we will continue anticoagulation for now as he is active and hasn't had any falls.   Followup in 6 months.   Lars Masson, MD 04/26/2014, 11:32 AM

## 2014-04-26 NOTE — Patient Instructions (Addendum)
Medication Instructions:   STOP TAKING PRADAXA NOW  STOP TAKING METOPROLOL NOW  START TAKING ELIQUIS 5 MG TWICE DAILY       Follow-Up:  Your physician wants you to follow-up in: 6 MONTHS WITH DR Johnell ComingsNELSON You will receive a reminder letter in the mail two months in advance. If you don't receive a letter, please call our office to schedule the follow-up appointment.

## 2014-05-23 ENCOUNTER — Encounter: Payer: Self-pay | Admitting: Internal Medicine

## 2014-05-23 ENCOUNTER — Ambulatory Visit (INDEPENDENT_AMBULATORY_CARE_PROVIDER_SITE_OTHER): Payer: Medicare HMO | Admitting: Internal Medicine

## 2014-05-23 VITALS — BP 106/65 | HR 62 | Ht 72.0 in | Wt 196.2 lb

## 2014-05-23 DIAGNOSIS — I10 Essential (primary) hypertension: Secondary | ICD-10-CM | POA: Diagnosis not present

## 2014-05-23 DIAGNOSIS — Z950 Presence of cardiac pacemaker: Secondary | ICD-10-CM

## 2014-05-23 LAB — CUP PACEART INCLINIC DEVICE CHECK
Lead Channel Impedance Value: 0 Ohm
Lead Channel Impedance Value: 843 Ohm
Lead Channel Pacing Threshold Amplitude: 0.5 V
Lead Channel Pacing Threshold Pulse Width: 0.4 ms
Lead Channel Setting Pacing Pulse Width: 0.4 ms
Lead Channel Setting Sensing Sensitivity: 2.8 mV
MDC IDC MSMT BATTERY IMPEDANCE: 628 Ohm
MDC IDC MSMT BATTERY REMAINING LONGEVITY: 76 mo
MDC IDC MSMT BATTERY VOLTAGE: 2.77 V
MDC IDC MSMT LEADCHNL RV SENSING INTR AMPL: 11.2 mV
MDC IDC SESS DTM: 20160517122945
MDC IDC SET LEADCHNL RV PACING AMPLITUDE: 2.5 V
MDC IDC STAT BRADY RV PERCENT PACED: 92 %

## 2014-05-23 NOTE — Progress Notes (Signed)
      HPI Christopher Buchanan returns today for followup. He is a pleasant 81 yo man who looks younger than his stated age, with a h/o symptomatic bradycardia, s/p PPM insertion. In the interim, he has been stable. He denies chest pain or sob. He is fairly sedentary. He has chronic atrial fib and CHB.  No Known Allergies   Current Outpatient Prescriptions  Medication Sig Dispense Refill  . alendronate (FOSAMAX) 70 MG tablet Take 70 mg by mouth once a week. Take with a full glass of water on an empty stomach.    Marland Kitchen. apixaban (ELIQUIS) 5 MG TABS tablet Take 1 tablet (5 mg total) by mouth 2 (two) times daily. 90 tablet 3  . lansoprazole (PREVACID) 30 MG capsule Take 30 mg by mouth daily.    Marland Kitchen. levothyroxine (SYNTHROID, LEVOTHROID) 200 MCG tablet Take 200 mcg by mouth daily before breakfast.    . Multiple Vitamins-Minerals (MULTIVITAMIN WITH MINERALS) tablet Take 1 tablet by mouth daily.    . potassium chloride SA (K-DUR,KLOR-CON) 20 MEQ tablet Take 1 tablet by mouth daily.     No current facility-administered medications for this visit.     Past Medical History  Diagnosis Date  . GERD (gastroesophageal reflux disease)   . Pacemaker medtronic single chamber  . Acute respiratory failure with hypoxia 10/07/2012  . COPD exacerbation 10/07/2012  . Hematuria 10/08/2012  . Hypercapnia 10/08/2012  . Hyperkalemia 10/08/2012  . Hyponatremia 10/08/2012  . HTN (hypertension) 10/07/2012    ROS:   All systems reviewed and negative except as noted in the HPI.   Past Surgical History  Procedure Laterality Date  . Pacemaker insertion       Family History  Problem Relation Age of Onset  . Stroke Father      History   Social History  . Marital Status: Married    Spouse Name: N/A  . Number of Children: N/A  . Years of Education: N/A   Occupational History  . Not on file.   Social History Main Topics  . Smoking status: Never Smoker   . Smokeless tobacco: Not on file  . Alcohol Use: No  . Drug  Use: No  . Sexual Activity: Not on file   Other Topics Concern  . Not on file   Social History Narrative     BP 106/65 mmHg  Pulse 62  Ht 6' (1.829 m)  Wt 196 lb 3.2 oz (88.996 kg)  BMI 26.60 kg/m2  Physical Exam:  Well appearing elderly man, NAD HEENT: Unremarkable Neck:  6 cm JVD, no thyromegally Back:  No CVA tenderness Lungs:  Clear with no wheezes HEART:  Regular rate rhythm, no murmurs, no rubs, no clicks Abd:  soft, positive bowel sounds, no organomegally, no rebound, no guarding Ext:  2 plus pulses, no edema, no cyanosis, no clubbing Skin:  No rashes no nodules Neuro:  CN II through XII intact, motor grossly intact   DEVICE  Normal device function.  See PaceArt for details.   Assess/Plan:

## 2014-05-23 NOTE — Patient Instructions (Signed)
Medication Instructions:  Your physician recommends that you continue on your current medications as directed. Please refer to the Current Medication list given to you today.   Labwork: None ordered     Testing/Procedures: None ordered  Follow-Up: Your physician wants you to follow-up in: 6 months in device clinic and 12 months with Dr Taylor You will receive a reminder letter in the mail two months in advance. If you don't receive a letter, please call our office to schedule the follow-up appointment.   Any Other Special Instructions Will Be Listed Below (If Applicable).   

## 2014-05-23 NOTE — Assessment & Plan Note (Signed)
His Medtronic VVI PPM is working normally. Will recheck in several months.

## 2014-05-23 NOTE — Assessment & Plan Note (Signed)
His blood pressure is normal today. He will continue his current meds.

## 2014-09-08 IMAGING — CT CT HEAD W/O CM
1 series · 16 of 30 positions shown, 20 images · non-contrast
Comparison: None.

CLINICAL DATA: Severe headache, altered mental status

CT HEAD WITHOUT CONTRAST
TECHNIQUE: Contiguous axial images were obtained from the base of
the skull through the vertex without contrast.

[Series 2: head 5.0 h30s · axial · 0.48mm/px · z∈[-200,-55]mm · 16 of 33 slices shown, 20 images]
[im 2/33  brain]
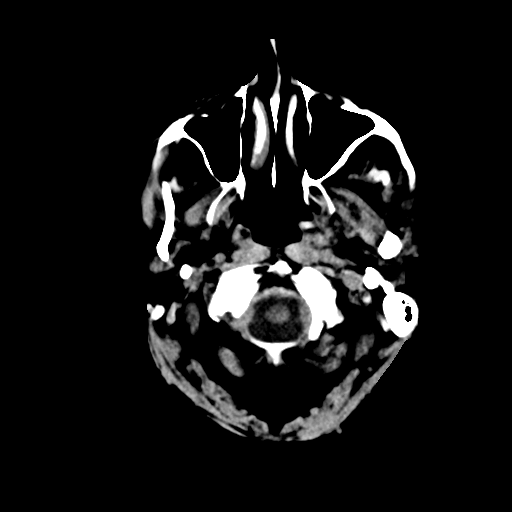
[im 2/33  bone]
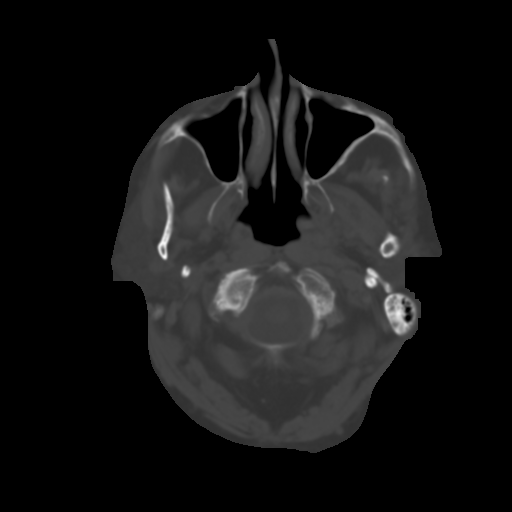
[im 4/33  brain]
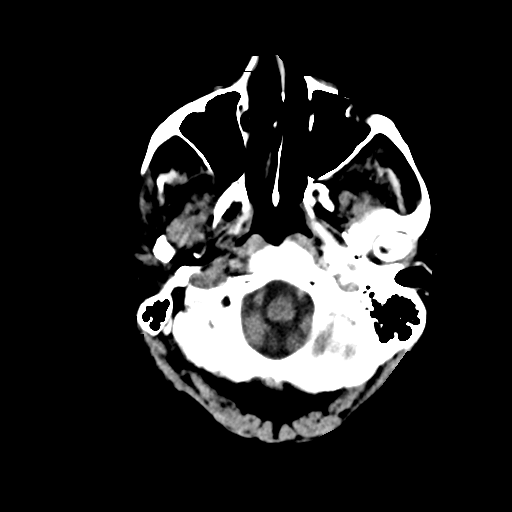
[im 6/33  brain]
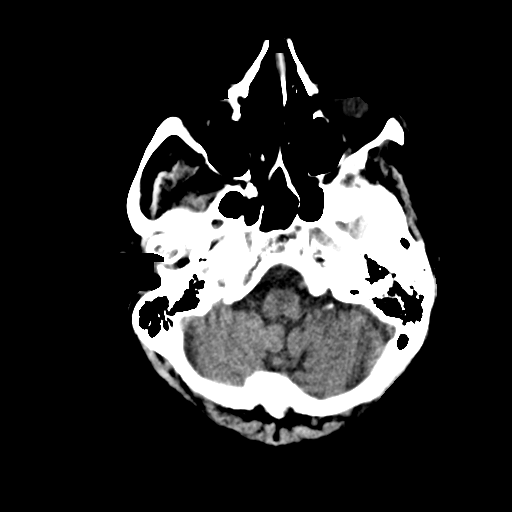
[im 8/33  brain]
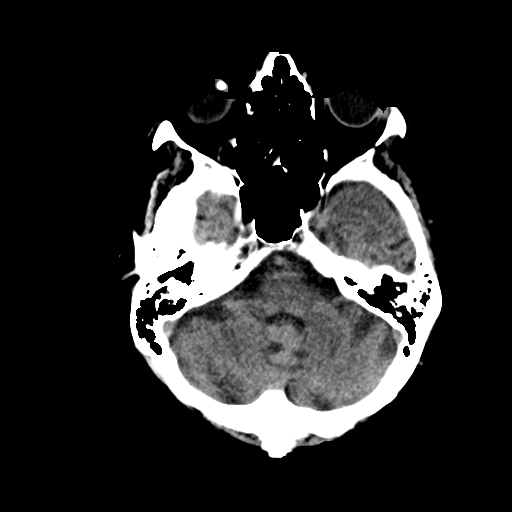
[im 9/33  brain]
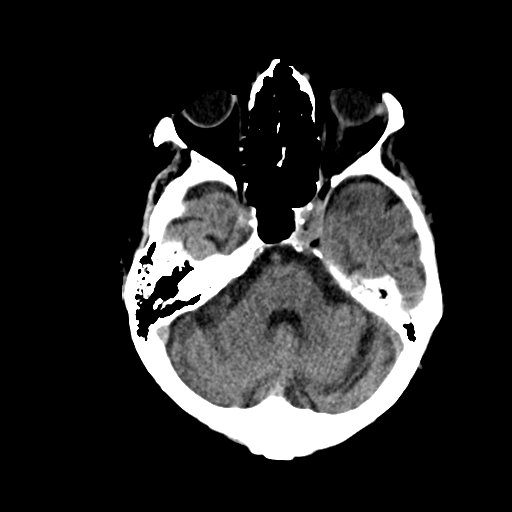
[im 9/33  bone]
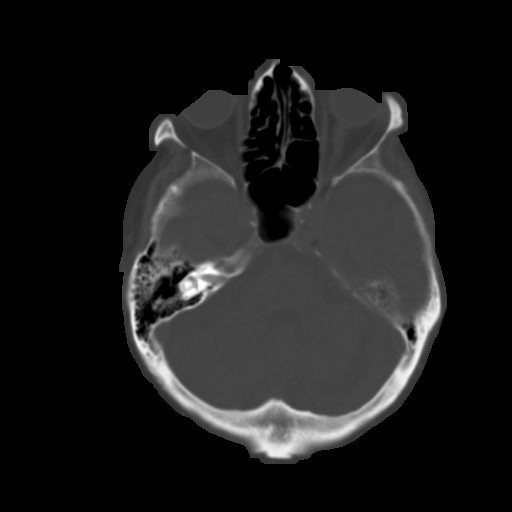
[im 12/33  brain]
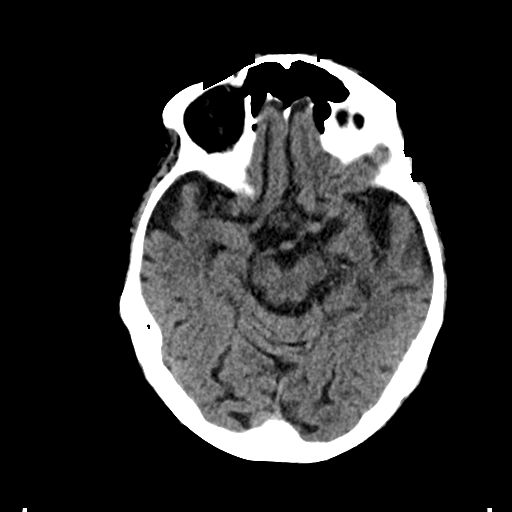
[im 14/33  brain]
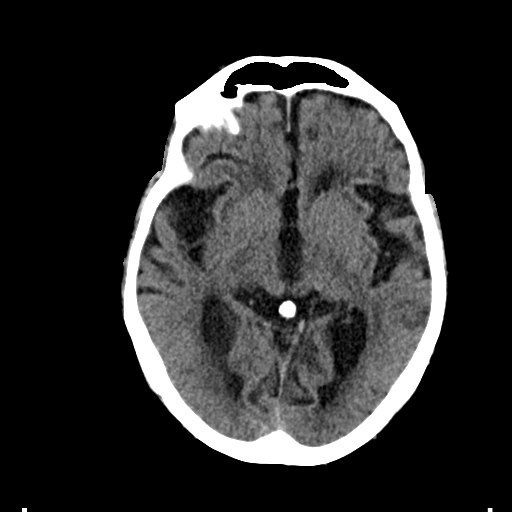
[im 16/33  brain]
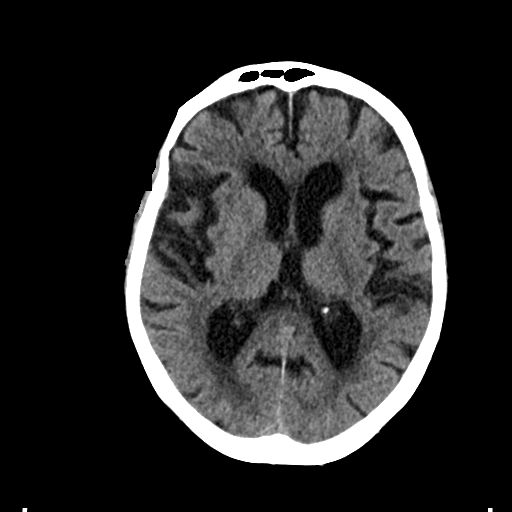
[im 17/33  brain]
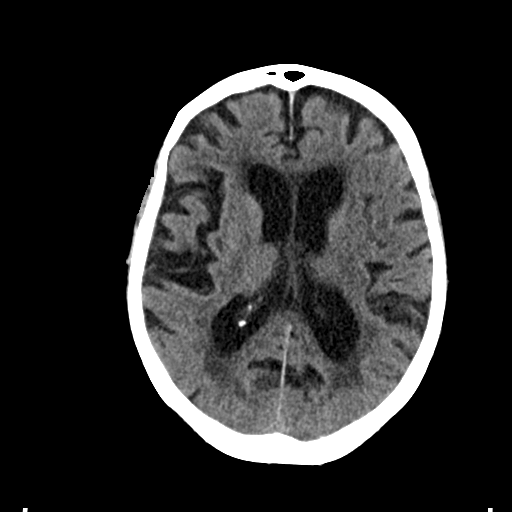
[im 17/33  bone]
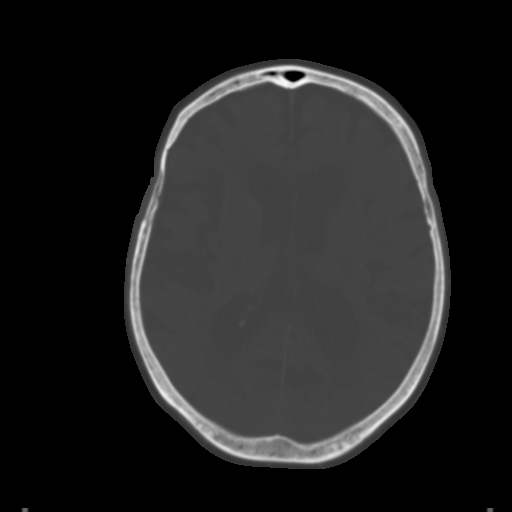
[im 19/33  brain]
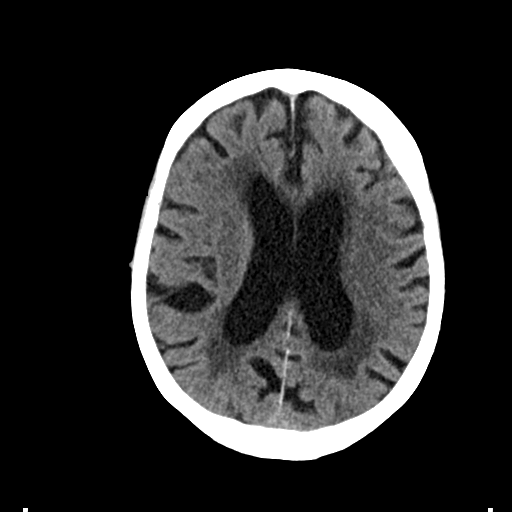
[im 21/33  brain]
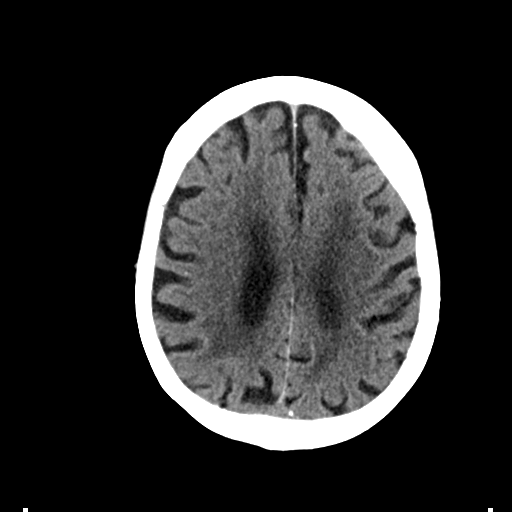
[im 24/33  brain]
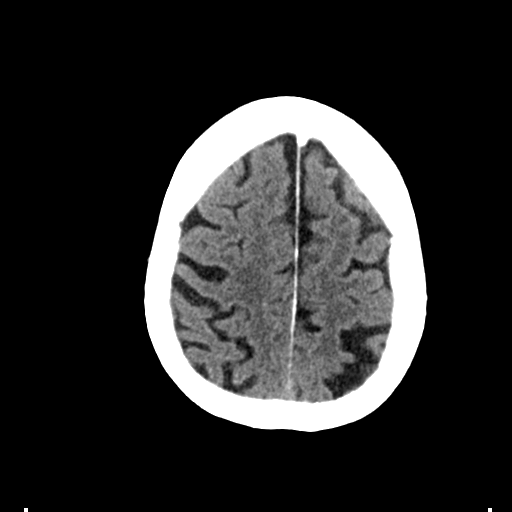
[im 25/33  brain]
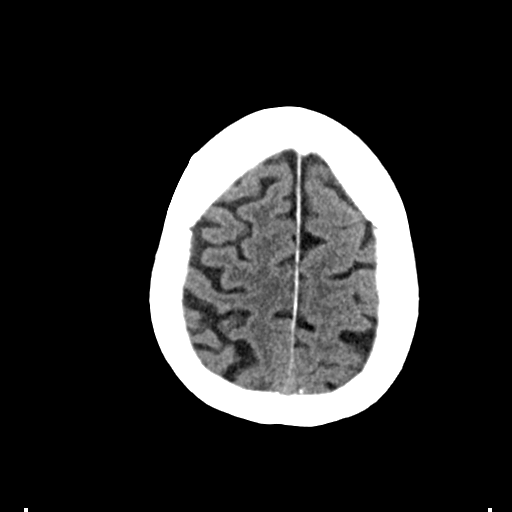
[im 25/33  bone]
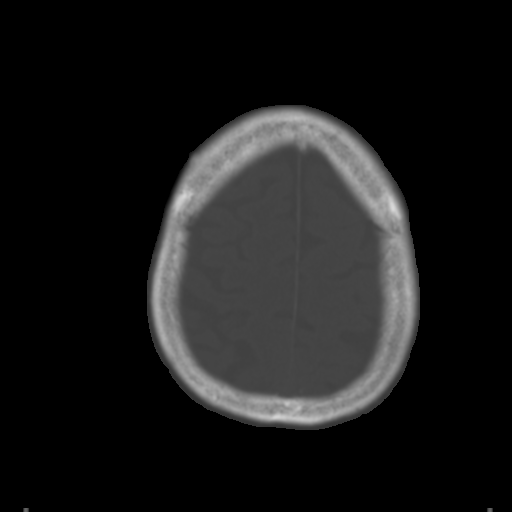
[im 27/33  brain]
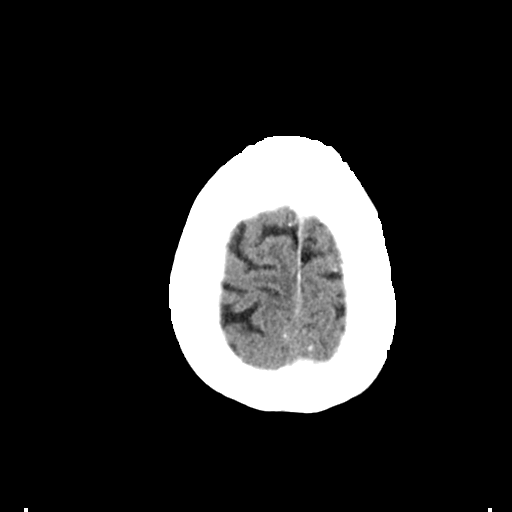
[im 29/33  brain]
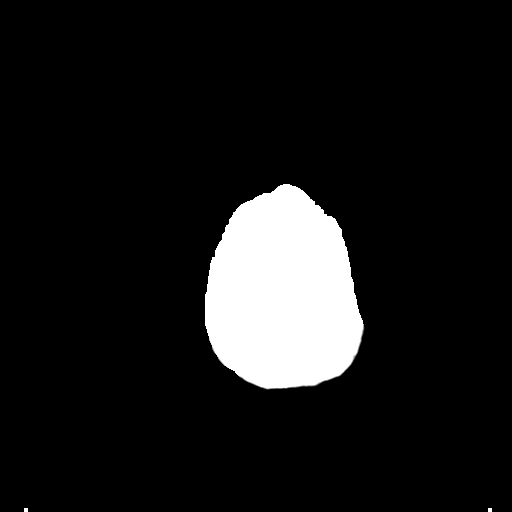
[im 31/33  brain]
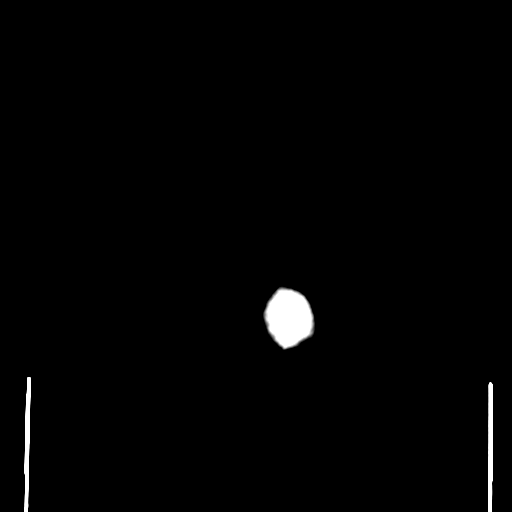

[16 of 30 positions shown; findings below may reference images not displayed]

FINDINGS: No acute intracranial hemorrhage, acute infarction, mass
lesion, mass effect, midline shift or hydrocephalus.  Gray-white
differentiation is preserved throughout.  Global cerebral and
cerebellar atrophy with compensatory ex vacuo dilatation of the
lateral ventricles.  Confluent periventricular and scattered deep
and subcortical white matter hypoattenuation most consistent with
the sequela of longstanding microvascular ischemia.
Atherosclerotic calcifications noted in the bilateral cavernous
carotid arteries.  No acute soft tissue or calvarial abnormality.
Globes and orbits are intact and unremarkable bilaterally.  Normal
aeration of the mastoid air cells and paranasal sinuses.
IMPRESSION: 1.  No acute intracranial abnormality.
2.  Atrophy and advanced microvascular ischemic white matter
disease.

## 2014-11-15 ENCOUNTER — Ambulatory Visit (INDEPENDENT_AMBULATORY_CARE_PROVIDER_SITE_OTHER): Payer: Medicare HMO | Admitting: Cardiology

## 2014-11-15 ENCOUNTER — Ambulatory Visit (INDEPENDENT_AMBULATORY_CARE_PROVIDER_SITE_OTHER): Payer: Medicare HMO | Admitting: *Deleted

## 2014-11-15 ENCOUNTER — Encounter: Payer: Self-pay | Admitting: Cardiology

## 2014-11-15 VITALS — BP 130/62 | HR 73 | Ht 72.0 in | Wt 195.0 lb

## 2014-11-15 DIAGNOSIS — Z950 Presence of cardiac pacemaker: Secondary | ICD-10-CM

## 2014-11-15 DIAGNOSIS — I10 Essential (primary) hypertension: Secondary | ICD-10-CM

## 2014-11-15 DIAGNOSIS — Z7901 Long term (current) use of anticoagulants: Secondary | ICD-10-CM

## 2014-11-15 DIAGNOSIS — I482 Chronic atrial fibrillation, unspecified: Secondary | ICD-10-CM

## 2014-11-15 DIAGNOSIS — I442 Atrioventricular block, complete: Secondary | ICD-10-CM

## 2014-11-15 DIAGNOSIS — E039 Hypothyroidism, unspecified: Secondary | ICD-10-CM | POA: Insufficient documentation

## 2014-11-15 DIAGNOSIS — D649 Anemia, unspecified: Secondary | ICD-10-CM | POA: Insufficient documentation

## 2014-11-15 DIAGNOSIS — M797 Fibromyalgia: Secondary | ICD-10-CM | POA: Insufficient documentation

## 2014-11-15 DIAGNOSIS — R972 Elevated prostate specific antigen [PSA]: Secondary | ICD-10-CM | POA: Insufficient documentation

## 2014-11-15 NOTE — Progress Notes (Signed)
Patient ID: Christopher Buchanan, male   DOB: Sep 27, 1920, 81 y.o.   MRN: [id number]    Patient Name: Christopher Buchanan Date of Encounter: 11/15/2014  Primary Care Provider:  Neldon Labella, MD Primary Cardiologist:  Lars Masson  Patient Profile  6 months follow up  Problem List   Past Medical History  Diagnosis Date  . GERD (gastroesophageal reflux disease)   . Pacemaker medtronic single chamber  . Acute respiratory failure with hypoxia (HCC) 10/07/2012  . COPD exacerbation (HCC) 10/07/2012  . Hematuria 10/08/2012  . Hypercapnia 10/08/2012  . Hyperkalemia 10/08/2012  . Hyponatremia 10/08/2012  . HTN (hypertension) 10/07/2012   Past Surgical History  Procedure Laterality Date  . Pacemaker insertion      Allergies  No Known Allergies  HPI  A very pleasant 81 year old gentleman who recently moved to the area and he is coming to establish cardiology care. The patient has a history of heart block unknown degree, for which he received a pacemaker 12 years ago with a generator change approximately one year ago. The patient also has a history of paroxysmal atrial fibrillation, on anticoagulation therapy with Pradaxa. His pacemaker was checked approximately one month ago and was functioning normally. The patient has no history of coronary artery disease, he denies any chest pain, the patient is a former smoker quit 20 years ago. He was recently hospitalized with worsening shortness of breath and was diagnosed with pneumonia for which he is being treated currently. He was sent home on home O2 therapy that he doesn't need any more. The patient denies palpitations or syncopal episode. Patient's wife mentions that occasionally he'll have lower extremity edema for which they purchased at a low to allow him to have his legs in horizontal position. This significantly improved his lower extremity edema.  The patientis coming after 6 months, he is asymptomatic, no SOB, CP or LE edema. No dizziness,  orthopnea or palpitations. He is mourning a loss of his wife of 70 years that passed just a week ago due to lung cancer.  10/26/2013 - the patient is coming after 6 months, he is doing very well, he stays active, walking with a walker, helping at home. He denies any CP, SOB, palpitations. He saw Dr Ladona Ridgel in May 2015 for PM check up and was functioning normally. No other complains, no LE edema, he hasn't been taking furosemide. No orthopnea, no PND.   11/15/2014 - 6 months follow up, the patient is doing well, he has moved in with another daughter. He is still very active around the house, walks with a walker, denies any falls, no chest pain, DOR, palpitations, syncope. No falls, no bleeding while being on Eliquis.  Home Medications  Prior to Admission medications   Medication Sig Start Date End Date Taking? Authorizing Provider  dabigatran (PRADAXA) 150 MG CAPS capsule Take 150 mg by mouth 2 (two) times daily.   Yes Historical Provider, MD  furosemide (LASIX) 20 MG tablet Take 1 tablet (20 mg total) by mouth every other day. 10/12/12  Yes Marinda Elk, MD  lansoprazole (PREVACID) 30 MG capsule Take 30 mg by mouth daily.   Yes Historical Provider, MD  levofloxacin (LEVAQUIN) 500 MG tablet Take 1 tablet (500 mg total) by mouth daily. For 10 days. 10/12/12  Yes Marinda Elk, MD  levothyroxine (SYNTHROID, LEVOTHROID) 200 MCG tablet Take 200 mcg by mouth daily before breakfast.   Yes Historical Provider, MD  metoprolol tartrate (LOPRESSOR) 25 MG tablet Take 25 mg by  mouth 2 (two) times daily.   Yes Historical Provider, MD  Multiple Vitamins-Minerals (MULTIVITAMIN WITH MINERALS) tablet Take 1 tablet by mouth daily.   Yes Historical Provider, MD  pravastatin (PRAVACHOL) 20 MG tablet Take 20 mg by mouth daily.   Yes Historical Provider, MD  predniSONE (DELTASONE) 1 MG tablet Take 3 tablets (3 mg total) by mouth daily. 10/16/12  Yes Marinda Elk, MD  Ropinirole HCl (REQUIP XL) 6 MG TB24  Take 6 mg by mouth daily.   Yes Historical Provider, MD  traMADol (ULTRAM) 50 MG tablet Take 50 mg by mouth every 8 (eight) hours as needed for pain.   Yes Historical Provider, MD    Family History  Family History  Problem Relation Age of Onset  . Stroke Father     Social History  Social History   Social History  . Marital Status: Married    Spouse Name: N/A  . Number of Children: N/A  . Years of Education: N/A   Occupational History  . Not on file.   Social History Main Topics  . Smoking status: Never Smoker   . Smokeless tobacco: Not on file  . Alcohol Use: No  . Drug Use: No  . Sexual Activity: Not on file   Other Topics Concern  . Not on file   Social History Narrative     Review of Systems as per history of present illness General:  No chills, fever, night sweats or weight changes.  Cardiovascular:  No chest pain, dyspnea on exertion, edema, orthopnea, palpitations, paroxysmal nocturnal dyspnea. Dermatological: No rash, lesions/masses Respiratory: No cough, dyspnea Urologic: No hematuria, dysuria Abdominal:   No nausea, vomiting, diarrhea, bright red blood per rectum, melena, or hematemesis Neurologic:  No visual changes, wkns, changes in mental status. All other systems reviewed and are otherwise negative except as noted above.  Physical Exam  Blood pressure 130/62, pulse 73, height 6' (1.829 m), weight 195 lb (88.451 kg).  General: Pleasant, NAD Psych: Normal affect. Neuro: Alert and oriented X 3. Moves all extremities spontaneously. HEENT: Normal  Neck: Supple without bruits or JVD. Lungs:  Resp regular and unlabored, minimal rales at the right bases. Heart: RRR no s3, s4, or murmurs. Abdomen: Soft, non-tender, non-distended, BS + x 4.  Extremities: No clubbing, cyanosis, mild non-pitting LE edema. DP/PT/Radials 2+ and equal bilaterally.  Accessory Clinical Findings  ECG - ventricular pacing with ventricular rate 68 beats per minute  TTE  10/08/2012 Study Conclusions  - Left ventricle: The cavity size was normal. There was moderate concentric hypertrophy. Systolic function was normal. Wall motion was normal; there were no regional wall motion abnormalities. - Ventricular septum: Septal motion showed paradox. These changes are consistent with a left bundle branch block. - Left atrium: The atrium was moderately dilated. - Right ventricle: The cavity size was moderately dilated. Wall thickness was normal. Systolic function was mildly to moderately reduced. - Right atrium: The atrium was moderately to severely dilated. - Tricuspid valve: Moderate regurgitation. - Pulmonary arteries: PA peak pressure: 54mm Hg (S). Impressions:  - The right ventricular systolic pressure was increased consistent with mild pulmonary hypertension.  Lipid Panel  No results found for: CHOL    Assessment & Plan  Very pleasant 81 year old gentleman   1. Heart block status post pacemaker placement, based on EKG pacemaker is functioning properly, it was last checked in 05/2014, today his ECG shows frequent PVCs in a pattern of bigeminy.He is asymptomatic, PM interrogation shows normal function.  2. H/O Right-sided heart failure possibly due to COPD/ recent pneumonia in 10/2012, now euvolemic, off Lasix, we will continue just PRN.  BMP checked at PCP in August and normal.  3. Hypertension, low for his age in April, now ok after stopping Metoprolol.    4. Hyperlipidemia- on the lipid profile, check by his primary care physician currently on pravastatin  5. Paroxysmal a-fib - continue eliquis, no falls or bleeding.   Followup in 6 months.   [name], Emrie Gayle H, MD 11/15/2014, 2:52 PM

## 2014-11-15 NOTE — Progress Notes (Signed)
Pacemaker check in clinic. Normal device function. Threshold, sensing, impedance consistent with previous measurements. Device programmed to maximize longevity. No high ventricular rates noted. Device programmed at appropriate safety margins. Histogram distribution appropriate for patient activity level. Device programmed to optimize intrinsic conduction. Estimated longevity 4.5-7 years.  ROV with GT in 6 months.

## 2014-11-15 NOTE — Patient Instructions (Signed)
Your physician recommends that you continue on your current medications as directed. Please refer to the Current Medication list given to you today.     Your physician wants you to follow-up in: 6 MONTHS WITH DR [name] You will receive a reminder letter in the mail two months in advance. If you don't receive a letter, please call our office to schedule the follow-up appointment.  

## 2014-11-24 ENCOUNTER — Emergency Department (HOSPITAL_COMMUNITY): Payer: Medicare HMO

## 2014-11-24 ENCOUNTER — Emergency Department (HOSPITAL_COMMUNITY)
Admission: EM | Admit: 2014-11-24 | Discharge: 2014-11-24 | Disposition: A | Discharge status: Home / Self Care | Payer: Medicare HMO | Attending: Emergency Medicine | Admitting: Emergency Medicine

## 2014-11-24 ENCOUNTER — Encounter (HOSPITAL_COMMUNITY): Payer: Self-pay | Admitting: Emergency Medicine

## 2014-11-24 DIAGNOSIS — R630 Anorexia: Secondary | ICD-10-CM | POA: Insufficient documentation

## 2014-11-24 DIAGNOSIS — J441 Chronic obstructive pulmonary disease with (acute) exacerbation: Secondary | ICD-10-CM | POA: Insufficient documentation

## 2014-11-24 DIAGNOSIS — Z8639 Personal history of other endocrine, nutritional and metabolic disease: Secondary | ICD-10-CM | POA: Insufficient documentation

## 2014-11-24 DIAGNOSIS — J9601 Acute respiratory failure with hypoxia: Secondary | ICD-10-CM | POA: Insufficient documentation

## 2014-11-24 DIAGNOSIS — R531 Weakness: Secondary | ICD-10-CM | POA: Insufficient documentation

## 2014-11-24 DIAGNOSIS — Z7901 Long term (current) use of anticoagulants: Secondary | ICD-10-CM | POA: Diagnosis not present

## 2014-11-24 DIAGNOSIS — Z950 Presence of cardiac pacemaker: Secondary | ICD-10-CM | POA: Insufficient documentation

## 2014-11-24 DIAGNOSIS — K219 Gastro-esophageal reflux disease without esophagitis: Secondary | ICD-10-CM | POA: Diagnosis not present

## 2014-11-24 DIAGNOSIS — I495 Sick sinus syndrome: Secondary | ICD-10-CM | POA: Insufficient documentation

## 2014-11-24 DIAGNOSIS — R109 Unspecified abdominal pain: Secondary | ICD-10-CM | POA: Diagnosis present

## 2014-11-24 DIAGNOSIS — I10 Essential (primary) hypertension: Secondary | ICD-10-CM | POA: Diagnosis not present

## 2014-11-24 DIAGNOSIS — R079 Chest pain, unspecified: Secondary | ICD-10-CM | POA: Insufficient documentation

## 2014-11-24 DIAGNOSIS — D649 Anemia, unspecified: Secondary | ICD-10-CM | POA: Diagnosis not present

## 2014-11-24 DIAGNOSIS — I4891 Unspecified atrial fibrillation: Secondary | ICD-10-CM | POA: Diagnosis not present

## 2014-11-24 DIAGNOSIS — R112 Nausea with vomiting, unspecified: Secondary | ICD-10-CM | POA: Diagnosis not present

## 2014-11-24 DIAGNOSIS — Z79899 Other long term (current) drug therapy: Secondary | ICD-10-CM | POA: Insufficient documentation

## 2014-11-24 DIAGNOSIS — R1013 Epigastric pain: Secondary | ICD-10-CM | POA: Diagnosis not present

## 2014-11-24 DIAGNOSIS — Z793 Long term (current) use of hormonal contraceptives: Secondary | ICD-10-CM | POA: Diagnosis not present

## 2014-11-24 LAB — BASIC METABOLIC PANEL
Anion gap: 8 (ref 5–15)
BUN: 24 mg/dL — AB (ref 6–20)
CHLORIDE: 105 mmol/L (ref 101–111)
CO2: 24 mmol/L (ref 22–32)
CREATININE: 1.2 mg/dL (ref 0.61–1.24)
Calcium: 9.2 mg/dL (ref 8.9–10.3)
GFR calc Af Amer: 59 mL/min — ABNORMAL LOW (ref >60)
GFR calc non Af Amer: 51 mL/min — ABNORMAL LOW (ref >60)
GLUCOSE: 153 mg/dL — AB (ref 65–99)
POTASSIUM: 4 mmol/L (ref 3.5–5.1)
SODIUM: 137 mmol/L (ref 135–145)

## 2014-11-24 LAB — I-STAT TROPONIN, ED
TROPONIN I, POC: 0.02 ng/mL (ref 0.00–0.08)
Troponin i, poc: 0.03 ng/mL (ref 0.00–0.08)

## 2014-11-24 LAB — CBC
HEMATOCRIT: 38.2 % — AB (ref 39.0–52.0)
Hemoglobin: 12.4 g/dL — ABNORMAL LOW (ref 13.0–17.0)
MCH: 31 pg (ref 26.0–34.0)
MCHC: 32.5 g/dL (ref 30.0–36.0)
MCV: 95.5 fL (ref 78.0–100.0)
PLATELETS: 227 10*3/uL (ref 150–400)
RBC: 4 MIL/uL — ABNORMAL LOW (ref 4.22–5.81)
RDW: 14.4 % (ref 11.5–15.5)
WBC: 9.4 10*3/uL (ref 4.0–10.5)

## 2014-11-24 MED ORDER — SODIUM CHLORIDE 0.9 % IV BOLUS (SEPSIS)
500.0000 mL | Freq: Once | INTRAVENOUS | Status: AC
Start: 2014-11-24 — End: 2014-11-24
  Administered 2014-11-24: 500 mL via INTRAVENOUS

## 2014-11-24 MED ORDER — ONDANSETRON 4 MG PO TBDP: ORAL_TABLET | ORAL | Status: DC

## 2014-11-24 MED ORDER — ONDANSETRON HCL 4 MG/2ML IJ SOLN
4.0000 mg | Freq: Once | INTRAMUSCULAR | Status: AC | PRN
  Administered 2014-11-24: 4 mg via INTRAVENOUS
  Filled 2014-11-24: qty 2

## 2014-11-24 NOTE — ED Provider Notes (Signed)
CSN: [id number]     Arrival date & time 11/24/14  1214 History   First MD Initiated Contact with Patient 11/24/14 1218     Chief Complaint  Patient presents with  . Chest Pain     (Consider location/radiation/quality/duration/timing/severity/associated sxs/prior Treatment) HPI Comments: 81 year old male with history of COPD, respiratory failure, pacemaker, anemia, atrial fibrillation on blood thinners, sick sinus syndrome pacemaker, pancreatic mass unsure specifics presents with recurrent nausea epigastric pain and chest pain. Patient has had intermittent nausea central abdominal pain and vomiting for the past 4 weeks and diagnosed with pancreas mass. Family and patient decided not to do anything aggressive with his age and medical problems and plan for supportive care. Patient had chest pain intermittent that began this morning while sitting. Patient has had a mild cough no fevers. Patient is nauseated and vomiting bile-like substance in triage. Mild oxygen requirement. No blood clot history. Chest pain nonradiating. The chest pain is new and the other symptoms ongoing. No chest pain currently.  The history is provided by the patient and a relative.    Past Medical History  Diagnosis Date  . GERD (gastroesophageal reflux disease)   . Pacemaker medtronic single chamber  . Acute respiratory failure with hypoxia (HCC) 10/07/2012  . COPD exacerbation (HCC) 10/07/2012  . Hematuria 10/08/2012  . Hypercapnia 10/08/2012  . Hyperkalemia 10/08/2012  . Hyponatremia 10/08/2012  . HTN (hypertension) 10/07/2012   Past Surgical History  Procedure Laterality Date  . Pacemaker insertion     Family History  Problem Relation Age of Onset  . Stroke Father    Social History  Substance Use Topics  . Smoking status: Never Smoker   . Smokeless tobacco: None  . Alcohol Use: No    Review of Systems  Constitutional: Positive for appetite change. Negative for fever and chills.  HENT: Negative for  congestion.   Eyes: Negative for visual disturbance.  Respiratory: Positive for cough. Negative for shortness of breath.   Cardiovascular: Positive for chest pain.  Gastrointestinal: Positive for nausea, vomiting and abdominal pain.  Genitourinary: Negative for dysuria and flank pain.  Musculoskeletal: Negative for back pain, neck pain and neck stiffness.  Skin: Negative for rash.  Neurological: Positive for weakness. Negative for light-headedness and headaches.      Allergies  Review of patient's allergies indicates no known allergies.  Home Medications   Prior to Admission medications   Medication Sig Start Date End Date Taking? Authorizing Provider  alendronate (FOSAMAX) 70 MG tablet Take 70 mg by mouth once a week. Take with a full glass of water on an empty stomach.   Yes Historical Provider, MD  apixaban (ELIQUIS) 5 MG TABS tablet Take 1 tablet (5 mg total) by mouth 2 (two) times daily. 04/26/14  Yes Lars Masson, MD  b complex vitamins tablet Take 1 tablet by mouth daily.   Yes Historical Provider, MD  lansoprazole (PREVACID) 30 MG capsule Take 30 mg by mouth daily.   Yes Historical Provider, MD  levothyroxine (SYNTHROID, LEVOTHROID) 200 MCG tablet Take 200 mcg by mouth daily before breakfast.   Yes Historical Provider, MD  Multiple Vitamins-Minerals (MULTIVITAMIN WITH MINERALS) tablet Take 1 tablet by mouth daily.   Yes Historical Provider, MD  ondansetron (ZOFRAN) 4 MG tablet Take 4 mg by mouth every 6 (six) hours as needed. for nausea 10/30/14  Yes Historical Provider, MD  ondansetron (ZOFRAN ODT) 4 MG disintegrating tablet  ODT q4 hours prn nausea/vomit 11/24/14   Blane Ohara, MD  BP 126/70 mmHg  Pulse 60  Temp(Src) 97.5 F (36.4 C) (Oral)  Resp 18  Ht  (1.854 m)  Wt 195 lb (88.451 kg)  BMI 25.73 kg/m2  SpO2 96% Physical Exam  Constitutional: He is oriented to person, place, and time. He appears well-developed and well-nourished.  HENT:  Head:  Normocephalic and atraumatic.  Milder mixed membranes  Eyes: Conjunctivae are normal. Right eye exhibits no discharge. Left eye exhibits no discharge.  Neck: Normal range of motion. Neck supple. No tracheal deviation present.  Cardiovascular: Normal rate and regular rhythm.   Pulmonary/Chest: Effort normal and breath sounds normal.  Abdominal: Soft. He exhibits no distension. There is tenderness (mild epigastric). There is no guarding.  Musculoskeletal: He exhibits no edema.  Neurological: He is alert and oriented to person, place, and time.  Skin: Skin is warm. No rash noted.  Psychiatric: He has a normal mood and affect.  Nursing note and vitals reviewed.   ED Course  Procedures (including critical care time) Labs Review Labs Reviewed  BASIC METABOLIC PANEL - Abnormal; Notable for the following:    Glucose, Bld 153 (*)    BUN 24 (*)    GFR calc non Af Amer 51 (*)    GFR calc Af Amer 59 (*)    All other components within normal limits  CBC - Abnormal; Notable for the following:    RBC 4.00 (*)    Hemoglobin 12.4 (*)    HCT 38.2 (*)    All other components within normal limits  I-STAT TROPOININ, ED  Rosezena Sensor, ED    Imaging Review Dg Chest 2 View  11/24/2014  CLINICAL DATA:  Chest pain.  Nausea and vomiting. EXAM: CHEST  2 VIEW COMPARISON:  12/26/2013 and 11/02/2012 FINDINGS: There is borderline cardiomegaly. Pulmonary vascularity is at the upper limits of normal. Patient has chronic interstitial and obstructive lung disease, but the interstitial accentuation has progressed on the right. No infiltrates or effusions. Tortuosity and calcification of the thoracic aorta. Old compression fractures in the mid and lower thoracic spine. Pacemaker in place. IMPRESSION: No acute abnormality.  Progressive chronic lung disease. Electronically Signed   By: Francene Boyers M.D.   On: 11/24/2014 13:18   I have personally reviewed and evaluated these images and lab results as part of my  medical decision-making.   EKG Interpretation   Date/Time:  Friday November 24 2014 12:19:48 EST Ventricular Rate:  72 PR Interval:    QRS Duration: 178 QT Interval:  471 QTC Calculation: 515 R Axis:   -77 Text Interpretation:  Accelerated junctional rhythm Left bundle branch  block Confirmed by Claus Silvestro  MD, Iyla Balzarini ([id number]) on 11/24/2014 12:36:06 PM      MDM   Final diagnoses:  Epigastric pain  Nausea and vomiting, vomiting of unspecified type   Patient presents with recurrent nausea vomiting epigastric pain and recent diagnosis of pancreatic mass. Plan for supportive care antiemetics, small fluid bolus. Chest pain is new may be related to ongoing symptoms from pancreas mass with age and other medical problems plan for cardiac screen. Family does not want CT scan or any aggressive measures taken at this time.  Nausea and vomiting resolved, oral fluid challenge. Delta troponin negative.  Results and differential diagnosis were discussed with the patient/parent/guardian. Xrays were independently reviewed by myself.  Close follow up outpatient was discussed, comfortable with the plan.   Medications  ondansetron (ZOFRAN) injection 4 mg (4 mg Intravenous Given 11/24/14 1243)  sodium chloride 0.9 %  bolus 500 mL (0 mLs Intravenous Stopped 11/24/14 1520)    Filed Vitals:   11/24/14 1430 11/24/14 1515 11/24/14 1545 11/24/14 1559  BP: 121/70 130/71 135/73 126/70  Pulse: 60 66 45 60  Temp:      TempSrc:      Resp: 21 19 26 18   Height:      Weight:      SpO2: 100% 100% 100% 96%    Final diagnoses:  Epigastric pain  Nausea and vomiting, vomiting of unspecified type       Blane OharaJoshua Onelia Cadmus, MD 11/26/14 0001

## 2014-11-24 NOTE — ED Notes (Signed)
Pt denies nausea at this time. Ice chips given

## 2014-11-24 NOTE — ED Notes (Signed)
MD at bedside. Zavitz 

## 2014-11-24 NOTE — ED Notes (Signed)
Pt presents with onset of mid-sternal chest pain that began this am while sitting at home. Pt states pain steady at this time in middle of chest. 2/10 at this time. Pt is nausea and vomiting bile during triage. Pt was 88% on RA, 2L [place] applied to pt.

## 2014-11-24 NOTE — Discharge Instructions (Signed)
If you were given medicines take as directed.  If you are on coumadin or contraceptives realize their levels and effectiveness is altered by many different medicines.  If you have any reaction (rash, tongues swelling, other) to the medicines stop taking and see a physician.    If your blood pressure was elevated in the ER make sure you follow up for management with a primary doctor or return for chest pain, shortness of breath or stroke symptoms.  Please follow up as directed and return to the ER or see a physician for new or worsening symptoms.  Thank you. Filed Vitals:   11/24/14 1430 11/24/14 1515 11/24/14 1545 11/24/14 1559  BP: 121/70 130/71 135/73 126/70  Pulse: 60 66 45 60  Temp:      TempSrc:      Resp: 21 19 26 18   Height:      Weight:      SpO2: 100% 100% 100% 96%

## 2014-12-19 ENCOUNTER — Encounter: Payer: Self-pay | Admitting: Internal Medicine

## 2014-12-24 ENCOUNTER — Encounter (HOSPITAL_COMMUNITY): Payer: Self-pay | Admitting: Emergency Medicine

## 2014-12-24 ENCOUNTER — Emergency Department (HOSPITAL_COMMUNITY)
Admission: EM | Admit: 2014-12-24 | Discharge: 2014-12-25 | Disposition: A | Discharge status: Home / Self Care | Payer: Medicare HMO | Attending: Emergency Medicine | Admitting: Emergency Medicine

## 2014-12-24 ENCOUNTER — Emergency Department (HOSPITAL_COMMUNITY): Payer: Medicare HMO

## 2014-12-24 DIAGNOSIS — Z950 Presence of cardiac pacemaker: Secondary | ICD-10-CM | POA: Insufficient documentation

## 2014-12-24 DIAGNOSIS — Z8639 Personal history of other endocrine, nutritional and metabolic disease: Secondary | ICD-10-CM | POA: Diagnosis not present

## 2014-12-24 DIAGNOSIS — Z79899 Other long term (current) drug therapy: Secondary | ICD-10-CM | POA: Diagnosis not present

## 2014-12-24 DIAGNOSIS — K219 Gastro-esophageal reflux disease without esophagitis: Secondary | ICD-10-CM | POA: Diagnosis not present

## 2014-12-24 DIAGNOSIS — Z7902 Long term (current) use of antithrombotics/antiplatelets: Secondary | ICD-10-CM | POA: Diagnosis not present

## 2014-12-24 DIAGNOSIS — I10 Essential (primary) hypertension: Secondary | ICD-10-CM | POA: Diagnosis not present

## 2014-12-24 DIAGNOSIS — R079 Chest pain, unspecified: Secondary | ICD-10-CM | POA: Diagnosis not present

## 2014-12-24 DIAGNOSIS — J441 Chronic obstructive pulmonary disease with (acute) exacerbation: Secondary | ICD-10-CM | POA: Insufficient documentation

## 2014-12-24 LAB — BASIC METABOLIC PANEL
ANION GAP: 8 (ref 5–15)
BUN: 22 mg/dL — ABNORMAL HIGH (ref 6–20)
CALCIUM: 9.5 mg/dL (ref 8.9–10.3)
CHLORIDE: 104 mmol/L (ref 101–111)
CO2: 24 mmol/L (ref 22–32)
Creatinine, Ser: 1.18 mg/dL (ref 0.61–1.24)
GFR calc non Af Amer: 52 mL/min — ABNORMAL LOW (ref >60)
GFR, EST AFRICAN AMERICAN: 60 mL/min — AB (ref >60)
GLUCOSE: 93 mg/dL (ref 65–99)
POTASSIUM: 4.7 mmol/L (ref 3.5–5.1)
Sodium: 136 mmol/L (ref 135–145)

## 2014-12-24 LAB — CBC
HEMATOCRIT: 42.6 % (ref 39.0–52.0)
HEMOGLOBIN: 13.7 g/dL (ref 13.0–17.0)
MCH: 30.8 pg (ref 26.0–34.0)
MCHC: 32.2 g/dL (ref 30.0–36.0)
MCV: 95.7 fL (ref 78.0–100.0)
Platelets: 239 10*3/uL (ref 150–400)
RBC: 4.45 MIL/uL (ref 4.22–5.81)
RDW: 14.6 % (ref 11.5–15.5)
WBC: 8.8 10*3/uL (ref 4.0–10.5)

## 2014-12-24 LAB — PROTIME-INR
INR: 1.11 (ref 0.00–1.49)
Prothrombin Time: 14.5 seconds (ref 11.6–15.2)

## 2014-12-24 LAB — I-STAT TROPONIN, ED: Troponin i, poc: 0.04 ng/mL (ref 0.00–0.08)

## 2014-12-24 MED ORDER — ASPIRIN 81 MG PO CHEW: 324.0000 mg | CHEWABLE_TABLET | Freq: Once | ORAL | Status: DC

## 2014-12-24 NOTE — ED Notes (Signed)
Denies active chest pain

## 2014-12-24 NOTE — ED Provider Notes (Signed)
CSN: [id number]     Arrival date & time 12/24/14  2159 History   First MD Initiated Contact with Patient 12/24/14 2217     Chief Complaint  Patient presents with  . Chest Pain     (Consider location/radiation/quality/duration/timing/severity/associated sxs/prior Treatment) HPI   Christopher Buchanan is a 81 year old male with past medical history of atrial fibrillation on Pradaxa, heart block with pacemaker implantation, COPD, HTN, GERD who presents to the emergency department today complaining of chest pain. Patient states that he was at rest watching TV when he felt gradual onset chest pain. Pain is located diffusely across patient's chest. Does not radiate. Patient felt associated nausea, no vomiting. The chest pain lasted for personally 1 hour and then resolved. No associated shortness of breath, dizziness, diaphoresis, syncope, numbness or tingling, LE swelling. Patient states that he had an episode like this one year ago and was told that it was due to indigestion.  Past Medical History  Diagnosis Date  . GERD (gastroesophageal reflux disease)   . Pacemaker medtronic single chamber  . Acute respiratory failure with hypoxia (HCC) 10/07/2012  . COPD exacerbation (HCC) 10/07/2012  . Hematuria 10/08/2012  . Hypercapnia 10/08/2012  . Hyperkalemia 10/08/2012  . Hyponatremia 10/08/2012  . HTN (hypertension) 10/07/2012   Past Surgical History  Procedure Laterality Date  . Pacemaker insertion     Family History  Problem Relation Age of Onset  . Stroke Father    Social History  Substance Use Topics  . Smoking status: Never Smoker   . Smokeless tobacco: None  . Alcohol Use: No    Review of Systems  All other systems reviewed and are negative.     Allergies  Review of patient's allergies indicates no known allergies.  Home Medications   Prior to Admission medications   Medication Sig Start Date End Date Taking? Authorizing Provider  acetaminophen (TYLENOL) 500 MG tablet Take 1,000  mg by mouth every 8 (eight) hours as needed for moderate pain.   Yes Historical Provider, MD  alendronate (FOSAMAX) 70 MG tablet Take 70 mg by mouth once a week. Take with a full glass of water on an empty stomach.   Yes Historical Provider, MD  apixaban (ELIQUIS) 5 MG TABS tablet Take 1 tablet (5 mg total) by mouth 2 (two) times daily. 04/26/14  Yes Lars Masson, MD  b complex vitamins tablet Take 1 tablet by mouth daily.   Yes Historical Provider, MD  lansoprazole (PREVACID) 30 MG capsule Take 30 mg by mouth daily.   Yes Historical Provider, MD  levothyroxine (SYNTHROID, LEVOTHROID) 200 MCG tablet Take 200 mcg by mouth daily before breakfast.   Yes Historical Provider, MD  Multiple Vitamins-Minerals (MULTIVITAMIN WITH MINERALS) tablet Take 1 tablet by mouth daily.   Yes Historical Provider, MD  ondansetron (ZOFRAN) 4 MG tablet Take 4 mg by mouth every 6 (six) hours as needed. for nausea 10/30/14  Yes Historical Provider, MD   BP 121/72 mmHg  Pulse 71  Temp(Src) 97.8 F (36.6 C) (Oral)  Resp 18  SpO2 98% Physical Exam  Constitutional: He is oriented to person, place, and time. He appears well-developed and well-nourished. No distress.  HENT:  Head: Normocephalic and atraumatic.  Mouth/Throat: No oropharyngeal exudate.  Eyes: Conjunctivae and EOM are normal. Pupils are equal, round, and reactive to light. Right eye exhibits no discharge. Left eye exhibits no discharge. No scleral icterus.  Neck: Neck supple. No JVD present.  Cardiovascular: Normal rate, regular rhythm, normal heart sounds and  intact distal pulses.  Exam reveals no gallop and no friction rub.   No murmur heard. Pulmonary/Chest: Effort normal and breath sounds normal. No respiratory distress. He has no wheezes. He has no rales. He exhibits no tenderness.  Abdominal: Soft. He exhibits no distension. There is no tenderness. There is no guarding.  Musculoskeletal: Normal range of motion. He exhibits no edema.  Neurological:  He is alert and oriented to person, place, and time. No cranial nerve deficit.  Strength 5/5 throughout. No sensory deficits.  No gait abnormality.  Skin: Skin is warm and dry. No rash noted. He is not diaphoretic. No erythema. No pallor.  Psychiatric: He has a normal mood and affect. His behavior is normal.  Nursing note and vitals reviewed.   ED Course  Procedures (including critical care time) Labs Review Labs Reviewed  BASIC METABOLIC PANEL - Abnormal; Notable for the following:    BUN 22 (*)    GFR calc non Af Amer 52 (*)    GFR calc Af Amer 60 (*)    All other components within normal limits  CBC  PROTIME-INR  I-STAT TROPOININ, ED    Imaging Review Dg Chest 2 View  12/24/2014  CLINICAL DATA:  81 year old male with chest pain EXAM: CHEST  2 VIEW COMPARISON:  Chest radiograph dated 11/24/2014 FINDINGS: Two views of the chest demonstrate emphysematous changes of the lungs. No focal consolidation, pleural effusion, or pneumothorax. Moderate cardiomegaly. Left pectoral pacemaker device. The aorta is tortuous. Advanced osteopenia with compression fracture of lower thoracic spine similar to prior study. No new fracture identified. IMPRESSION: No active cardiopulmonary disease. Electronically Signed   By: Elgie CollardArash  Radparvar M.D.   On: 12/24/2014 23:37   I have personally reviewed and evaluated these images and lab results as part of my medical decision-making.   EKG Interpretation   Date/Time:  Sunday December 24 2014 22:06:22 EST Ventricular Rate:  70 PR Interval:    QRS Duration: 176 QT Interval:  454 QTC Calculation: 490 R Axis:   -74 Text Interpretation:  Electronic ventricular pacemaker No significant  change since last tracing Confirmed by [facility] DecaturCHLOSSMAN MD, ERIN ([id number]) on  12/24/2014 10:18:55 PM      MDM   Final diagnoses:  Chest pain, unspecified chest pain type    [age] y.o M presents with episode of chest pain that lasted approximately 1 hour PTA. Chest pain free in  ED. Pt appears well in ED. VSS. EKG abnormal due to pts pacemaker. Initial troponin 0.04. CXR unchanged from previous  Discussed with pt that in order to r/o ACS will need to perform serial cardiac enzymes which will take 3 hours miminum, with a potential admission to the hospital. Pt and his family state that they do not want to wait in the ED nor do they want admission. Dr. Dalene SeltzerSchlossman discussed the risks and benefits of this. Pt expresses complete understanding and would like to go home and follow up with his cardiologist.   Pt refused ASA. He also does not want his pacemaker interrogated today. Pt is hemodynamically stable and requesting discharge. Return precautions outlined in patient discharge instructions. Patient was discussed with and seen by Dr. Dalene SeltzerSchlossman who agrees with the treatment plan.      Lester KinsmanSamantha Tripp [place] Valley[place], PA-C 12/26/14 1711  Alvira MondayErin Schlossman, MD 12/28/14 1501

## 2014-12-24 NOTE — ED Notes (Signed)
Pt. reports intermittent central chest pain with nausea and vomitting onset this evening , took Zofran prior to arrival with mild relief. Denies SOB  or diaphoresis .

## 2014-12-25 NOTE — Discharge Instructions (Signed)
Chest Pain Observation °It is often hard to give a specific diagnosis for the cause of chest pain. Among other possibilities your symptoms might be caused by inadequate oxygen delivery to your heart (angina). Angina that is not treated or evaluated can lead to a heart attack (myocardial infarction) or death. °Blood tests, electrocardiograms, and X-rays may have been done to help determine a possible cause of your chest pain. After evaluation and observation, your health care provider has determined that it is unlikely your pain was caused by an unstable condition that requires hospitalization. However, a full evaluation of your pain may need to be completed, with additional diagnostic testing as directed. It is very important to keep your follow-up appointments. Not keeping your follow-up appointments could result in permanent heart damage, disability, or death. If there is any problem keeping your follow-up appointments, you must call your health care provider. °HOME CARE INSTRUCTIONS  °Due to the slight chance that your pain could be angina, it is important to follow your health care provider's treatment plan and also maintain a healthy lifestyle: °· Maintain or work toward achieving a healthy weight. °· Stay physically active and exercise regularly. °· Decrease your salt intake. °· Eat a balanced, healthy diet. Talk to a dietitian to learn about heart-healthy foods. °· Increase your fiber intake by including whole grains, vegetables, fruits, and nuts in your diet. °· Avoid situations that cause stress, anger, or depression. °· Take medicines as advised by your health care provider. Report any side effects to your health care provider. Do not stop medicines or adjust the dosages on your own. °· Quit smoking. Do not use nicotine patches or gum until you check with your health care provider. °· Keep your blood pressure, blood sugar, and cholesterol levels within normal limits. °· Limit alcohol intake to no more than  1 drink per day for women who are not pregnant and 2 drinks per day for men. °· Do not abuse drugs. °SEEK IMMEDIATE MEDICAL CARE IF: °You have severe chest pain or pressure which may include symptoms such as: °· You feel pain or pressure in your arms, neck, jaw, or back. °· You have severe back or abdominal pain, feel sick to your stomach (nauseous), or throw up (vomit). °· You are sweating profusely. °· You are having a fast or irregular heartbeat. °· You feel short of breath while at rest. °· You notice increasing shortness of breath during rest, sleep, or with activity. °· You have chest pain that does not get better after rest or after taking your usual medicine. °· You wake from sleep with chest pain. °· You are unable to sleep because you cannot breathe. °· You develop a frequent cough or you are coughing up blood. °· You feel dizzy, faint, or experience extreme fatigue. °· You develop severe weakness, dizziness, fainting, or chills. °Any of these symptoms may represent a serious problem that is an emergency. Do not wait to see if the symptoms will go away. Call your local emergency services (911 in the [place] Do not drive yourself to the hospital. °MAKE SURE YOU: °· Understand these instructions. °· Will watch your condition. °· Will get help right away if you are not doing well or get worse. °  °This information is not intended to replace advice given to you by your health care provider. Make sure you discuss any questions you have with your health care provider. °  °Document Released: 01/25/2010 Document Revised: 12/28/2012 Document Reviewed: 06/24/2012 °[employer]vier Interactive Patient   Education 2016 [employer]  Nonspecific Chest Pain  Chest pain can be caused by many different conditions. There is always a chance that your pain could be related to something serious, such as a heart attack or a blood clot in your lungs. Chest pain can also be caused by conditions that are not life-threatening. If you  have chest pain, it is very important to follow up with your health care provider. CAUSES  Chest pain can be caused by:  Heartburn.  Pneumonia or bronchitis.  Anxiety or stress.  Inflammation around your heart (pericarditis) or lung (pleuritis or pleurisy).  A blood clot in your lung.  A collapsed lung (pneumothorax). It can develop suddenly on its own (spontaneous pneumothorax) or from trauma to the chest.  Shingles infection (varicella-zoster virus).  Heart attack.  Damage to the bones, muscles, and cartilage that make up your chest wall. This can include:  Bruised bones due to injury.  Strained muscles or cartilage due to frequent or repeated coughing or overwork.  Fracture to one or more ribs.  Sore cartilage due to inflammation (costochondritis). RISK FACTORS  Risk factors for chest pain may include:  Activities that increase your risk for trauma or injury to your chest.  Respiratory infections or conditions that cause frequent coughing.  Medical conditions or overeating that can cause heartburn.  Heart disease or family history of heart disease.  Conditions or health behaviors that increase your risk of developing a blood clot.  Having had chicken pox (varicella zoster). SIGNS AND SYMPTOMS Chest pain can feel like:  Burning or tingling on the surface of your chest or deep in your chest.  Crushing, pressure, aching, or squeezing pain.  Dull or sharp pain that is worse when you move, cough, or take a deep breath.  Pain that is also felt in your back, neck, shoulder, or arm, or pain that spreads to any of these areas. Your chest pain may come and go, or it may stay constant. DIAGNOSIS Lab tests or other studies may be needed to find the cause of your pain. Your health care provider may have you take a test called an ambulatory ECG (electrocardiogram). An ECG records your heartbeat patterns at the time the test is performed. You may also have other tests, such  as:  Transthoracic echocardiogram (TTE). During echocardiography, sound waves are used to create a picture of all of the heart structures and to look at how blood flows through your heart.  Transesophageal echocardiogram (TEE).This is a more advanced imaging test that obtains images from inside your body. It allows your health care provider to see your heart in finer detail.  Cardiac monitoring. This allows your health care provider to monitor your heart rate and rhythm in real time.  Holter monitor. This is a portable device that records your heartbeat and can help to diagnose abnormal heartbeats. It allows your health care provider to track your heart activity for several days, if needed.  Stress tests. These can be done through exercise or by taking medicine that makes your heart beat more quickly.  Blood tests.  Imaging tests. TREATMENT  Your treatment depends on what is causing your chest pain. Treatment may include:  Medicines. These may include:  Acid blockers for heartburn.  Anti-inflammatory medicine.  Pain medicine for inflammatory conditions.  Antibiotic medicine, if an infection is present.  Medicines to dissolve blood clots.  Medicines to treat coronary artery disease.  Supportive care for conditions that do not require medicines. This may  include:  Resting.  Applying heat or cold packs to injured areas.  Limiting activities until pain decreases. HOME CARE INSTRUCTIONS  If you were prescribed an antibiotic medicine, finish it all even if you start to feel better.  Avoid any activities that bring on chest pain.  Do not use any tobacco products, including cigarettes, chewing tobacco, or electronic cigarettes. If you need help quitting, ask your health care provider.  Do not drink alcohol.  Take medicines only as directed by your health care provider.  Keep all follow-up visits as directed by your health care provider. This is important. This includes any  further testing if your chest pain does not go away.  If heartburn is the cause for your chest pain, you may be told to keep your head raised (elevated) while sleeping. This reduces the chance that acid will go from your stomach into your esophagus.  Make lifestyle changes as directed by your health care provider. These may include:  Getting regular exercise. Ask your health care provider to suggest some activities that are safe for you.  Eating a heart-healthy diet. A registered dietitian can help you to learn healthy eating options.  Maintaining a healthy weight.  Managing diabetes, if necessary.  Reducing stress. SEEK MEDICAL CARE IF:  Your chest pain does not go away after treatment.  You have a rash with blisters on your chest.  You have a fever. SEEK IMMEDIATE MEDICAL CARE IF:   Your chest pain is worse.  You have an increasing cough, or you cough up blood.  You have severe abdominal pain.  You have severe weakness.  You faint.  You have chills.  You have sudden, unexplained chest discomfort.  You have sudden, unexplained discomfort in your arms, back, neck, or jaw.  You have shortness of breath at any time.  You suddenly start to sweat, or your skin gets clammy.  You feel nauseous or you vomit.  You suddenly feel light-headed or dizzy.  Your heart begins to beat quickly, or it feels like it is skipping beats. These symptoms may represent a serious problem that is an emergency. Do not wait to see if the symptoms will go away. Get medical help right away. Call your local emergency services (911 in the [place] Do not drive yourself to the hospital.   This information is not intended to replace advice given to you by your health care provider. Make sure you discuss any questions you have with your health care provider.   Follow up with your cardiologist as soon as possible for re-evaluation. Return to the ED if you experience worsening or return of your  symptoms, fever, sweating, vomiting, shortness of breath, numbness/tingling in your extremities.

## 2015-01-30 ENCOUNTER — Encounter (HOSPITAL_COMMUNITY): Payer: Self-pay | Admitting: Emergency Medicine

## 2015-01-30 ENCOUNTER — Emergency Department (HOSPITAL_COMMUNITY)
Admission: EM | Admit: 2015-01-30 | Discharge: 2015-01-30 | Disposition: A | Discharge status: Home / Self Care | Payer: Medicare HMO | Attending: Emergency Medicine | Admitting: Emergency Medicine

## 2015-01-30 DIAGNOSIS — Y9289 Other specified places as the place of occurrence of the external cause: Secondary | ICD-10-CM | POA: Diagnosis not present

## 2015-01-30 DIAGNOSIS — Z7902 Long term (current) use of antithrombotics/antiplatelets: Secondary | ICD-10-CM | POA: Diagnosis not present

## 2015-01-30 DIAGNOSIS — T783XXA Angioneurotic edema, initial encounter: Secondary | ICD-10-CM | POA: Insufficient documentation

## 2015-01-30 DIAGNOSIS — Z79899 Other long term (current) drug therapy: Secondary | ICD-10-CM | POA: Diagnosis not present

## 2015-01-30 DIAGNOSIS — Z950 Presence of cardiac pacemaker: Secondary | ICD-10-CM | POA: Insufficient documentation

## 2015-01-30 DIAGNOSIS — Y998 Other external cause status: Secondary | ICD-10-CM | POA: Diagnosis not present

## 2015-01-30 DIAGNOSIS — X58XXXA Exposure to other specified factors, initial encounter: Secondary | ICD-10-CM | POA: Insufficient documentation

## 2015-01-30 DIAGNOSIS — J441 Chronic obstructive pulmonary disease with (acute) exacerbation: Secondary | ICD-10-CM | POA: Insufficient documentation

## 2015-01-30 DIAGNOSIS — Z8639 Personal history of other endocrine, nutritional and metabolic disease: Secondary | ICD-10-CM | POA: Diagnosis not present

## 2015-01-30 DIAGNOSIS — I10 Essential (primary) hypertension: Secondary | ICD-10-CM | POA: Diagnosis not present

## 2015-01-30 DIAGNOSIS — K219 Gastro-esophageal reflux disease without esophagitis: Secondary | ICD-10-CM | POA: Insufficient documentation

## 2015-01-30 DIAGNOSIS — R220 Localized swelling, mass and lump, head: Secondary | ICD-10-CM | POA: Diagnosis present

## 2015-01-30 DIAGNOSIS — Y9389 Activity, other specified: Secondary | ICD-10-CM | POA: Insufficient documentation

## 2015-01-30 LAB — COMPREHENSIVE METABOLIC PANEL
ALBUMIN: 3.3 g/dL — AB (ref 3.5–5.0)
ALT: 11 U/L — ABNORMAL LOW (ref 17–63)
ANION GAP: 8 (ref 5–15)
AST: 19 U/L (ref 15–41)
Alkaline Phosphatase: 73 U/L (ref 38–126)
BILIRUBIN TOTAL: 0.9 mg/dL (ref 0.3–1.2)
BUN: 23 mg/dL — ABNORMAL HIGH (ref 6–20)
CO2: 28 mmol/L (ref 22–32)
Calcium: 9.2 mg/dL (ref 8.9–10.3)
Chloride: 102 mmol/L (ref 101–111)
Creatinine, Ser: 1.39 mg/dL — ABNORMAL HIGH (ref 0.61–1.24)
GFR, EST AFRICAN AMERICAN: 49 mL/min — AB (ref >60)
GFR, EST NON AFRICAN AMERICAN: 42 mL/min — AB (ref >60)
GLUCOSE: 109 mg/dL — AB (ref 65–99)
POTASSIUM: 4 mmol/L (ref 3.5–5.1)
Sodium: 138 mmol/L (ref 135–145)
TOTAL PROTEIN: 7.8 g/dL (ref 6.5–8.1)

## 2015-01-30 LAB — CBC WITH DIFFERENTIAL/PLATELET
BASOS PCT: 1 %
Basophils Absolute: 0.1 10*3/uL (ref 0.0–0.1)
Eosinophils Absolute: 0.5 10*3/uL (ref 0.0–0.7)
Eosinophils Relative: 5 %
HEMATOCRIT: 39.9 % (ref 39.0–52.0)
Hemoglobin: 13.1 g/dL (ref 13.0–17.0)
Lymphocytes Relative: 19 %
Lymphs Abs: 1.9 10*3/uL (ref 0.7–4.0)
MCH: 30.5 pg (ref 26.0–34.0)
MCHC: 32.8 g/dL (ref 30.0–36.0)
MCV: 93 fL (ref 78.0–100.0)
MONO ABS: 1.1 10*3/uL — AB (ref 0.1–1.0)
MONOS PCT: 11 %
NEUTROS ABS: 6.5 10*3/uL (ref 1.7–7.7)
Neutrophils Relative %: 64 %
Platelets: 258 10*3/uL (ref 150–400)
RBC: 4.29 MIL/uL (ref 4.22–5.81)
RDW: 14.8 % (ref 11.5–15.5)
WBC: 10 10*3/uL (ref 4.0–10.5)

## 2015-01-30 MED ORDER — DEXAMETHASONE SODIUM PHOSPHATE 4 MG/ML IJ SOLN
10.0000 mg | Freq: Once | INTRAMUSCULAR | Status: AC
  Administered 2015-01-30: 10 mg via INTRAMUSCULAR
  Filled 2015-01-30: qty 3

## 2015-01-30 NOTE — ED Notes (Signed)
Pt is in stable condition upon d/c and is escorted from ED via wheelchair. 

## 2015-01-30 NOTE — ED Notes (Signed)
Pt. woke up this morning with tongue swelling , airway intact / respirations unlabored , speech clear . Pt. took 2 capsules of benadryl prior to arrival .

## 2015-01-30 NOTE — ED Provider Notes (Addendum)
CSN: [id number]     Arrival date & time 01/30/15  0550 History   First MD Initiated Contact with Patient 01/30/15 0930     No chief complaint on file.     HPI Patient was awakened during the night swelling of his tongue.  Had some difficulty breathing and swallowing.  Took Benadryl at home.  States swelling has improved.  This is third episode of swelling either on lips or tongue in the last 6 months.  No new medications.  Patient is not on an ACE inhibitor. Past Medical History  Diagnosis Date  . GERD (gastroesophageal reflux disease)   . Pacemaker medtronic single chamber  . Acute respiratory failure with hypoxia (HCC) 10/07/2012  . COPD exacerbation (HCC) 10/07/2012  . Hematuria 10/08/2012  . Hypercapnia 10/08/2012  . Hyperkalemia 10/08/2012  . Hyponatremia 10/08/2012  . HTN (hypertension) 10/07/2012   Past Surgical History  Procedure Laterality Date  . Pacemaker insertion     Family History  Problem Relation Age of Onset  . Stroke Father    Social History  Substance Use Topics  . Smoking status: Never Smoker   . Smokeless tobacco: None  . Alcohol Use: No    Review of Systems  All other systems reviewed and are negative  Allergies  Review of patient's allergies indicates no known allergies.  Home Medications   Prior to Admission medications   Medication Sig Start Date End Date Taking? Authorizing Provider  acetaminophen (TYLENOL) 500 MG tablet Take 1,000 mg by mouth every 8 (eight) hours as needed for moderate pain.   Yes Historical Provider, MD  apixaban (ELIQUIS) 5 MG TABS tablet Take 1 tablet (5 mg total) by mouth 2 (two) times daily. 04/26/14  Yes Lars Masson, MD  b complex vitamins tablet Take 1 tablet by mouth daily.   Yes Historical Provider, MD  diphenhydrAMINE (BENADRYL) 25 MG tablet Take 50 mg by mouth once.   Yes Historical Provider, MD  lansoprazole (PREVACID) 30 MG capsule Take 30 mg by mouth daily.   Yes Historical Provider, MD  levothyroxine  (SYNTHROID, LEVOTHROID) 200 MCG tablet Take 200 mcg by mouth daily before breakfast.   Yes Historical Provider, MD  Multiple Vitamins-Minerals (MULTIVITAMIN WITH MINERALS) tablet Take 1 tablet by mouth daily.   Yes Historical Provider, MD  NON FORMULARY Apply 1 application topically daily. 1:1 Refridgerated Sarna Lotion:Triamcinolone Cream   Yes Historical Provider, MD  ondansetron (ZOFRAN) 4 MG tablet Take 4 mg by mouth every 6 (six) hours as needed. for nausea 10/30/14  Yes Historical Provider, MD  alendronate (FOSAMAX) 70 MG tablet Take 70 mg by mouth once a week. Take with a full glass of water on an empty stomach.    Historical Provider, MD   BP 123/66 mmHg  Pulse 63  Temp(Src) 98.2 F (36.8 C) (Oral)  Resp 16  Ht  (1.854 m)  Wt 198 lb (89.812 kg)  BMI 26.13 kg/m2  SpO2 94% Physical Exam Physical Exam  Nursing note and vitals reviewed. Constitutional: He is oriented to person, place, and time. He appears well-developed and well-nourished. No distress.  HENT:  Mouth: Some edema to the tongue noted.  No evidence of stridor patient handling oral secretions. Head: Normocephalic and atraumatic.  Eyes: Pupils are equal, round, and reactive to light.  Neck: Normal range of motion.  Cardiovascular: Normal rate and intact distal pulses.   Pulmonary/Chest: No respiratory distress.  Abdominal: Normal appearance. He exhibits no distension.  Musculoskeletal: Normal range of motion.  Neurological: He is alert and oriented to person, place, and time. No cranial nerve deficit.  Skin: Skin is warm and dry. No rash noted.   ED Course  Procedures (including critical care time) Medications  dexamethasone (DECADRON) injection 10 mg (10 mg Intramuscular Given 01/30/15 1000)    Labs Review Labs Reviewed  CBC WITH DIFFERENTIAL/PLATELET - Abnormal; Notable for the following:    Monocytes Absolute 1.1 (*)    All other components within normal limits  COMPREHENSIVE METABOLIC PANEL - Abnormal;  Notable for the following:    Glucose, Bld 109 (*)    BUN 23 (*)    Creatinine, Ser 1.39 (*)    Albumin 3.3 (*)    ALT 11 (*)    GFR calc non Af Amer 42 (*)    GFR calc Af Amer 49 (*)    All other components within normal limits     After treatment in the ED the patient states his tongue has gone down some and he wants to go home.  She was able to swallow liquids and had normal airway difficulty prior to discharge.  MDM   Final diagnoses:  Angioedema, initial encounter        Nelva Nay, MD 01/30/15 1229  Nelva Nay, MD 01/31/15 [phone]

## 2015-01-30 NOTE — Discharge Instructions (Signed)
Angioedema  Angioedema is a sudden swelling of tissues, often of the skin. It can occur on the face or genitals or in the abdomen or other body parts. The swelling usually develops over a short period and gets better in 24 to 48 hours. It often begins during the night and is found when the person wakes up. The person may also get red, itchy patches of skin (hives). Angioedema can be dangerous if it involves swelling of the air passages.   Depending on the cause, episodes of angioedema may only happen once, come back in unpredictable patterns, or repeat for several years and then gradually fade away.   CAUSES   Angioedema can be caused by an allergic reaction to various triggers. It can also result from nonallergic causes, including reactions to drugs, immune system disorders, viral infections, or an abnormal gene that is passed to you from your parents (hereditary). For some people with angioedema, the cause is unknown.   Some things that can trigger angioedema include:    Foods.    Medicines, such as ACE inhibitors, ARBs, nonsteroidal anti-inflammatory agents, or estrogen.    Latex.    Animal saliva.    Insect stings.    Dyes used in X-rays.    Mild injury.    Dental work.   Surgery.   Stress.    Sudden changes in temperature.    Exercise.  SIGNS AND SYMPTOMS    Swelling of the skin.   Hives. If these are present, there is also intense itching.   Redness in the affected area.    Pain in the affected area.   Swollen lips or tongue.   Breathing problems. This may happen if the air passages swell.   Wheezing.  If internal organs are involved, there may be:    Nausea.    Abdominal pain.    Vomiting.    Difficulty swallowing.    Difficulty passing urine.  DIAGNOSIS    Your health care provider will examine the affected area and take a medical and family history.   Various tests may be done to help determine the cause. Tests may include:   Allergy skin tests to see if the problem  is an allergic reaction.    Blood tests to check for hereditary angioedema.    Tests to check for underlying diseases that could cause the condition.    A review of your medicines, including over-the-counter medicines, may be done.  TREATMENT   Treatment will depend on the cause of the angioedema. Possible treatments include:    Removal of anything that triggered the condition (such as stopping certain medicines).    Medicines to treat symptoms or prevent attacks. Medicines given may include:     Antihistamines.     Epinephrine injection.     Steroids.    Hospitalization may be required for severe attacks. If the air passages are affected, it can be an emergency. Tubes may need to be placed to keep the airway open.  HOME CARE INSTRUCTIONS    Take all medicines as directed by your health care provider.   If you were given medicines for emergency allergy treatment, always carry them with you.   Wear a medical bracelet as directed by your health care provider.    Avoid known triggers.  SEEK MEDICAL CARE IF:    You have repeat attacks of angioedema.    Your attacks are more frequent or more severe despite preventive measures.    You have hereditary angioedema   and are considering having children. It is important to discuss with your health care provider the risks of passing the condition on to your children.  SEEK IMMEDIATE MEDICAL CARE IF:    You have severe swelling of the mouth, tongue, or lips.   You have difficulty breathing.    You have difficulty swallowing.    You faint.  MAKE SURE YOU:   Understand these instructions.   Will watch your condition.   Will get help right away if you are not doing well or get worse.     This information is not intended to replace advice given to you by your health care provider. Make sure you discuss any questions you have with your health care provider.     Document Released: 03/03/2001 Document Revised: 01/13/2014 Document Reviewed:  08/16/2012  [employer] Interactive Patient Education 2016 [employer]

## 2015-03-13 ENCOUNTER — Other Ambulatory Visit: Payer: Self-pay | Admitting: Cardiology

## 2015-05-14 ENCOUNTER — Ambulatory Visit (INDEPENDENT_AMBULATORY_CARE_PROVIDER_SITE_OTHER): Payer: Medicare HMO | Admitting: Cardiology

## 2015-05-14 ENCOUNTER — Encounter: Payer: Self-pay | Admitting: Cardiology

## 2015-05-14 VITALS — BP 100/70 | HR 82 | Ht 73.0 in | Wt 192.2 lb

## 2015-05-14 DIAGNOSIS — I495 Sick sinus syndrome: Secondary | ICD-10-CM

## 2015-05-14 DIAGNOSIS — I482 Chronic atrial fibrillation, unspecified: Secondary | ICD-10-CM

## 2015-05-14 DIAGNOSIS — I10 Essential (primary) hypertension: Secondary | ICD-10-CM

## 2015-05-14 DIAGNOSIS — Z950 Presence of cardiac pacemaker: Secondary | ICD-10-CM

## 2015-05-14 DIAGNOSIS — I442 Atrioventricular block, complete: Secondary | ICD-10-CM

## 2015-05-14 NOTE — Progress Notes (Signed)
Patient ID: Christopher Buchanan, male   DOB: 1920/11/11, 82 y.o.   MRN: [id number]    Patient Name: Christopher Buchanan Date of Encounter: 05/14/2015  Primary Care Provider:  Neldon Labella, MD Primary Cardiologist:  Tobias Alexander H  Patient Profile  6 months follow up  Problem List   Past Medical History  Diagnosis Date  . GERD (gastroesophageal reflux disease)   . Pacemaker medtronic single chamber  . Acute respiratory failure with hypoxia (HCC) 10/07/2012  . COPD exacerbation (HCC) 10/07/2012  . Hematuria 10/08/2012  . Hypercapnia 10/08/2012  . Hyperkalemia 10/08/2012  . Hyponatremia 10/08/2012  . HTN (hypertension) 10/07/2012   Past Surgical History  Procedure Laterality Date  . Pacemaker insertion      Allergies  No Known Allergies  HPI  A very pleasant 82 year old gentleman who recently moved to the area and he is coming to establish cardiology care. The patient has a history of heart block unknown degree, for which he received a pacemaker 12 years ago with a generator change approximately one year ago. The patient also has a history of paroxysmal atrial fibrillation, on anticoagulation therapy with Pradaxa. His pacemaker was checked approximately one month ago and was functioning normally. The patient has no history of coronary artery disease, he denies any chest pain, the patient is a former smoker quit 20 years ago. He was recently hospitalized with worsening shortness of breath and was diagnosed with pneumonia for which he is being treated currently. He was sent home on home O2 therapy that he doesn't need any more. The patient denies palpitations or syncopal episode. Patient's wife mentions that occasionally he'll have lower extremity edema for which they purchased at a low to allow him to have his legs in horizontal position. This significantly improved his lower extremity edema. He is mourning a loss of his wife of 70 years that passed just a week ago due to lung cancer.  05/14/2015 -  6 months follow up, he feels great, no bleeding with Eliquis, he walks with a walker and denies falls, no palpitations, syncope, no chest pain, LE edema, no DOE. Accompanied by his daughter.  Home Medications  Prior to Admission medications   Medication Sig Start Date End Date Taking? Authorizing Provider  dabigatran (PRADAXA) 150 MG CAPS capsule Take 150 mg by mouth 2 (two) times daily.   Yes Historical Provider, MD  furosemide (LASIX) 20 MG tablet Take 1 tablet (20 mg total) by mouth every other day. 10/12/12  Yes Marinda Elk, MD  lansoprazole (PREVACID) 30 MG capsule Take 30 mg by mouth daily.   Yes Historical Provider, MD  levofloxacin (LEVAQUIN) 500 MG tablet Take 1 tablet (500 mg total) by mouth daily. For 10 days. 10/12/12  Yes Marinda Elk, MD  levothyroxine (SYNTHROID, LEVOTHROID) 200 MCG tablet Take 200 mcg by mouth daily before breakfast.   Yes Historical Provider, MD  metoprolol tartrate (LOPRESSOR) 25 MG tablet Take 25 mg by mouth 2 (two) times daily.   Yes Historical Provider, MD  Multiple Vitamins-Minerals (MULTIVITAMIN WITH MINERALS) tablet Take 1 tablet by mouth daily.   Yes Historical Provider, MD  pravastatin (PRAVACHOL) 20 MG tablet Take 20 mg by mouth daily.   Yes Historical Provider, MD  predniSONE (DELTASONE) 1 MG tablet Take 3 tablets (3 mg total) by mouth daily. 10/16/12  Yes Marinda Elk, MD  Ropinirole HCl (REQUIP XL) 6 MG TB24 Take 6 mg by mouth daily.   Yes Historical Provider, MD  traMADol (ULTRAM) 50 MG  tablet Take 50 mg by mouth every 8 (eight) hours as needed for pain.   Yes Historical Provider, MD    Family History  Family History  Problem Relation Age of Onset  . Stroke Father     Social History  Social History   Social History  . Marital Status: Widowed    Spouse Name: N/A  . Number of Children: N/A  . Years of Education: N/A   Occupational History  . Not on file.   Social History Main Topics  . Smoking status: Never  Smoker   . Smokeless tobacco: Not on file  . Alcohol Use: No  . Drug Use: No  . Sexual Activity: Not on file   Other Topics Concern  . Not on file   Social History Narrative     Review of Systems as per history of present illness General:  No chills, fever, night sweats or weight changes.  Cardiovascular:  No chest pain, dyspnea on exertion, edema, orthopnea, palpitations, paroxysmal nocturnal dyspnea. Dermatological: No rash, lesions/masses Respiratory: No cough, dyspnea Urologic: No hematuria, dysuria Abdominal:   No nausea, vomiting, diarrhea, bright red blood per rectum, melena, or hematemesis Neurologic:  No visual changes, wkns, changes in mental status. All other systems reviewed and are otherwise negative except as noted above.  Physical Exam  Blood pressure 100/70, pulse 82, height 6\' 1"  (1.854 m), weight 192 lb 3.2 oz (87.181 kg).  General: Pleasant, NAD Psych: Normal affect. Neuro: Alert and oriented X 3. Moves all extremities spontaneously. HEENT: Normal  Neck: Supple without bruits or JVD. Lungs:  Resp regular and unlabored, minimal rales at the right bases. Heart: RRR no s3, s4, or murmurs. Abdomen: Soft, non-tender, non-distended, BS + x 4.  Extremities: No clubbing, cyanosis, mild non-pitting LE edema. DP/PT/Radials 2+ and equal bilaterally.  Accessory Clinical Findings  ECG - ventricular pacing with ventricular rate 68 beats per minute  TTE 10/08/2012 Study Conclusions  - Left ventricle: The cavity size was normal. There was moderate concentric hypertrophy. Systolic function was normal. Wall motion was normal; there were no regional wall motion abnormalities. - Ventricular septum: Septal motion showed paradox. These changes are consistent with a left bundle branch block. - Left atrium: The atrium was moderately dilated. - Right ventricle: The cavity size was moderately dilated. Wall thickness was normal. Systolic function was mildly to moderately  reduced. - Right atrium: The atrium was moderately to severely dilated. - Tricuspid valve: Moderate regurgitation. - Pulmonary arteries: PA peak pressure: 54mm Hg (S). Impressions:  - The right ventricular systolic pressure was increased consistent with mild pulmonary hypertension.  Lipid Panel  No results found for: CHOL    Assessment & Plan  Very pleasant 82 year old gentleman   1. Heart block status post pacemaker placement, based on EKG pacemaker is functioning properly, it was last checked in 11/2014, we will notify PM clinic to schedule him again. The patient is asymptomatic. PM site looks good.  2. H/O Right-sided heart failure possibly due to COPD/ recent pneumonia in 10/2012, now euvolemic, off Lasix, we will continue just PRN. He is euvolemic. Crea 1.39 from prior 1.0, he state that he drink a lot of fluids.  3. Hypertension, low for his age, he is asymptomatic.    4. Hyperlipidemia- previously on pravastatin, now off considering his age.  5. Paroxysmal a-fib - continue eliquis, no falls or bleeding.   Followup in 6 months.   [name], Harshil Cavallaro H, MD 05/14/2015, 1:58 PM

## 2015-05-14 NOTE — Patient Instructions (Signed)
Medication Instructions:   Your physician recommends that you continue on your current medications as directed. Please refer to the Current Medication list given to you today.     Follow-Up:  DR Delton SeeNELSON WANTS YOU TO SCHEDULE AN APPOINTMENT FOR NOW TO HAVE YOUR PACEMAKER CHECKED HERE IN OUR EP CLINIC   Your physician wants you to follow-up in: 6 MONTHS WITH DR Johnell ComingsNELSON You will receive a reminder letter in the mail two months in advance. If you don't receive a letter, please call our office to schedule the follow-up appointment.        If you need a refill on your cardiac medications before your next appointment, please call your pharmacy.

## 2015-05-30 ENCOUNTER — Encounter: Payer: Self-pay | Admitting: Internal Medicine

## 2015-05-30 ENCOUNTER — Ambulatory Visit (INDEPENDENT_AMBULATORY_CARE_PROVIDER_SITE_OTHER): Payer: Medicare HMO | Admitting: Internal Medicine

## 2015-05-30 VITALS — BP 92/60 | HR 71 | Ht 73.0 in | Wt 190.4 lb

## 2015-05-30 DIAGNOSIS — I482 Chronic atrial fibrillation, unspecified: Secondary | ICD-10-CM

## 2015-05-30 NOTE — Patient Instructions (Signed)
Medication Instructions:  Your physician recommends that you continue on your current medications as directed. Please refer to the Current Medication list given to you today.   Labwork: none  Testing/Procedures: none  Follow-Up: Your physician wants you to follow-up in: 6 months in Device Clinic. You will receive a reminder letter in the mail two months in advance. If you don't receive a letter, please call our office to schedule the follow-up appointment.  Your physician wants you to follow-up in: 12 months with Dr.Taylor. You will receive a reminder letter in the mail two months in advance. If you don't receive a letter, please call our office to schedule the follow-up appointment.   Any Other Special Instructions Will Be Listed Below (If Applicable).     If you need a refill on your cardiac medications before your next appointment, please call your pharmacy.

## 2015-05-30 NOTE — Progress Notes (Signed)
HPI Christopher Buchanan returns today for followup. He is a pleasant 82 yo man who looks younger than his stated age, with a h/o symptomatic bradycardia due to complete heart block in the setting of atrial fibrillation, s/p PPM insertion. In the interim, he has been stable. He denies chest pain or sob. He is fairly sedentary. He walks inside his house but uses a walker when he has longer walking. No Known Allergies   Current Outpatient Prescriptions  Medication Sig Dispense Refill  . acetaminophen (TYLENOL) 500 MG tablet Take 1,000 mg by mouth every 8 (eight) hours as needed for moderate pain.    Marland Kitchen alendronate (FOSAMAX) 70 MG tablet Take 70 mg by mouth once a week. Take with a full glass of water on an empty stomach.    Marland Kitchen b complex vitamins tablet Take 1 tablet by mouth daily.    . diphenhydrAMINE (BENADRYL) 25 MG tablet Take 50 mg by mouth once.    Marland Kitchen ELIQUIS 5 MG TABS tablet TAKE 1 TABLET TWICE DAILY 180 tablet 0  . lansoprazole (PREVACID) 30 MG capsule Take 30 mg by mouth daily.    Marland Kitchen levothyroxine (SYNTHROID, LEVOTHROID) 200 MCG tablet Take 200 mcg by mouth daily before breakfast.    . Multiple Vitamins-Minerals (MULTIVITAMIN WITH MINERALS) tablet Take 1 tablet by mouth daily.    . NON FORMULARY Apply 1 application topically daily. 1:1 Refridgerated Sarna Lotion:Triamcinolone Cream    . ondansetron (ZOFRAN) 4 MG tablet Take 4 mg by mouth every 6 (six) hours as needed. for nausea  0   No current facility-administered medications for this visit.     Past Medical History  Diagnosis Date  . GERD (gastroesophageal reflux disease)   . Pacemaker medtronic single chamber  . Acute respiratory failure with hypoxia (HCC) 10/07/2012  . COPD exacerbation (HCC) 10/07/2012  . Hematuria 10/08/2012  . Hypercapnia 10/08/2012  . Hyperkalemia 10/08/2012  . Hyponatremia 10/08/2012  . HTN (hypertension) 10/07/2012    ROS:   All systems reviewed and negative except as noted in the HPI.   Past Surgical  History  Procedure Laterality Date  . Pacemaker insertion       Family History  Problem Relation Age of Onset  . Stroke Father      Social History   Social History  . Marital Status: Widowed    Spouse Name: N/A  . Number of Children: N/A  . Years of Education: N/A   Occupational History  . Not on file.   Social History Main Topics  . Smoking status: Never Smoker   . Smokeless tobacco: Not on file  . Alcohol Use: No  . Drug Use: No  . Sexual Activity: Not on file   Other Topics Concern  . Not on file   Social History Narrative     BP 92/60 mmHg  Pulse 71  Ht  (1.854 m)  Wt 190 lb 6.4 oz (86.365 kg)  BMI 25.13 kg/m2  SpO2 94%  Physical Exam:  Well appearing elderly man, NAD HEENT: Unremarkable Neck:  6 cm JVD, no thyromegally Back:  No CVA tenderness Lungs:  Clear with no wheezes HEART:  Regular rate rhythm, no murmurs, no rubs, no clicks Abd:  soft, positive bowel sounds, no organomegally, no rebound, no guarding Ext:  2 plus pulses, no edema, no cyanosis, no clubbing Skin:  No rashes no nodules Neuro:  CN II through XII intact, motor grossly intact   DEVICE  Normal device function.  See PaceArt for details.   Assess/Plan: 1. Atrial fib - his ventricular rate is well controlled. Will follow. 2. PPM - his medtronic VVI PM is working normally. Will follow. 3. coags - he will continue Eliquis 4. HTN - his blood pressure is low. Will follow.  Christopher Buchanan,M.D.

## 2015-06-15 LAB — CUP PACEART INCLINIC DEVICE CHECK
Implantable Lead Implant Date: 20001006
Implantable Lead Location: 753860
Lead Channel Setting Pacing Pulse Width: 0.4 ms
MDC IDC SESS DTM: 20170609105614
MDC IDC SET LEADCHNL RV PACING AMPLITUDE: 2.5 V
MDC IDC SET LEADCHNL RV SENSING SENSITIVITY: 4 mV

## 2015-12-28 ENCOUNTER — Emergency Department (HOSPITAL_COMMUNITY)
Admission: EM | Admit: 2015-12-28 | Discharge: 2015-12-28 | Disposition: A | Discharge status: Home / Self Care | Payer: Medicare HMO | Attending: Emergency Medicine | Admitting: Emergency Medicine

## 2015-12-28 ENCOUNTER — Encounter (HOSPITAL_COMMUNITY): Payer: Self-pay

## 2015-12-28 ENCOUNTER — Emergency Department (HOSPITAL_COMMUNITY): Payer: Medicare HMO

## 2015-12-28 DIAGNOSIS — Z7901 Long term (current) use of anticoagulants: Secondary | ICD-10-CM | POA: Insufficient documentation

## 2015-12-28 DIAGNOSIS — I10 Essential (primary) hypertension: Secondary | ICD-10-CM | POA: Diagnosis not present

## 2015-12-28 DIAGNOSIS — J690 Pneumonitis due to inhalation of food and vomit: Secondary | ICD-10-CM

## 2015-12-28 DIAGNOSIS — R531 Weakness: Secondary | ICD-10-CM

## 2015-12-28 DIAGNOSIS — Z79899 Other long term (current) drug therapy: Secondary | ICD-10-CM | POA: Diagnosis not present

## 2015-12-28 DIAGNOSIS — Z950 Presence of cardiac pacemaker: Secondary | ICD-10-CM | POA: Insufficient documentation

## 2015-12-28 DIAGNOSIS — R05 Cough: Secondary | ICD-10-CM | POA: Diagnosis present

## 2015-12-28 LAB — COMPREHENSIVE METABOLIC PANEL
ALBUMIN: 2.6 g/dL — AB (ref 3.5–5.0)
ALT: 19 U/L (ref 17–63)
ANION GAP: 9 (ref 5–15)
AST: 36 U/L (ref 15–41)
Alkaline Phosphatase: 83 U/L (ref 38–126)
BUN: 23 mg/dL — AB (ref 6–20)
CHLORIDE: 103 mmol/L (ref 101–111)
CO2: 27 mmol/L (ref 22–32)
Calcium: 9.2 mg/dL (ref 8.9–10.3)
Creatinine, Ser: 1.3 mg/dL — ABNORMAL HIGH (ref 0.61–1.24)
GFR calc Af Amer: 53 mL/min — ABNORMAL LOW (ref >60)
GFR calc non Af Amer: 46 mL/min — ABNORMAL LOW (ref >60)
GLUCOSE: 134 mg/dL — AB (ref 65–99)
POTASSIUM: 3.8 mmol/L (ref 3.5–5.1)
SODIUM: 139 mmol/L (ref 135–145)
TOTAL PROTEIN: 7.8 g/dL (ref 6.5–8.1)
Total Bilirubin: 1.5 mg/dL — ABNORMAL HIGH (ref 0.3–1.2)

## 2015-12-28 LAB — CBC
HEMATOCRIT: 40.6 % (ref 39.0–52.0)
Hemoglobin: 13.4 g/dL (ref 13.0–17.0)
MCH: 31.4 pg (ref 26.0–34.0)
MCHC: 33 g/dL (ref 30.0–36.0)
MCV: 95.1 fL (ref 78.0–100.0)
PLATELETS: 319 10*3/uL (ref 150–400)
RBC: 4.27 MIL/uL (ref 4.22–5.81)
RDW: 15.1 % (ref 11.5–15.5)
WBC: 13.2 10*3/uL — AB (ref 4.0–10.5)

## 2015-12-28 LAB — LIPASE, BLOOD: Lipase: 15 U/L (ref 11–51)

## 2015-12-28 MED ORDER — LEVOFLOXACIN 500 MG PO TABS
500.0000 mg | ORAL_TABLET | Freq: Once | ORAL | Status: AC
  Administered 2015-12-28: 500 mg via ORAL
  Filled 2015-12-28 (×2): qty 1

## 2015-12-28 MED ORDER — ONDANSETRON HCL 4 MG/2ML IJ SOLN
4.0000 mg | Freq: Once | INTRAMUSCULAR | Status: AC
  Administered 2015-12-28: 4 mg via INTRAVENOUS
  Filled 2015-12-28: qty 2

## 2015-12-28 MED ORDER — LEVOFLOXACIN 500 MG PO TABS: 500.0000 mg | ORAL_TABLET | Freq: Every day | ORAL | 0 refills | Status: AC

## 2015-12-28 MED ORDER — SODIUM CHLORIDE 0.9 % IV BOLUS (SEPSIS)
1000.0000 mL | Freq: Once | INTRAVENOUS | Status: AC
  Administered 2015-12-28: 1000 mL via INTRAVENOUS

## 2015-12-28 NOTE — ED Notes (Addendum)
Pt not in room. Will obtain urine when pt returns.

## 2015-12-28 NOTE — Care Management Note (Signed)
Case Management Note  Patient Details  Name: Christopher BerkshireRobert Buchanan MRN: [id number] Date of Birth: 02-08-1920  Subjective/Objective:                  Pt started to become fatigued and have cough and congestion starting a week ago. [name]/From home with daughter.  Action/Plan: Follow for disposition needs.   Expected Discharge Date:  12/28/15               Expected Discharge Plan:  Home/Self Care  In-House Referral:  NA  Discharge planning Services  CM Consult  Post Acute Care Choice:  NA Choice offered to:  Adult Children  DME Arranged:  N/A DME Agency:  NA  HH Arranged:  RN, PT, Nurse's Aide, Social Work [employer] Agency:  [facility]  Status of Service:  Completed, signed off  If discussed at [employer] Meetings, dates discussed:    Additional Comments: Christopher Buchanan J. Christopher RoersWood, RN, BSN, [employer] [phone] Spoke with pt at bedside regarding discharge planning for [facility]. Offered pt daughter list of home health agencies to choose from.  Pt chose [facility] to render services. Christopher EchevariaKaren Nussbaum, RN of [facility] notified.  No DME needs identified at this time.  Christopher CohnWood, Christopher Deckard, RN 12/28/2015, 2:27 PM

## 2015-12-28 NOTE — ED Provider Notes (Signed)
[facility]-EMERGENCY DEPT Provider Note   CSN: [id number] Arrival date & time: 12/28/15  1020     History   Chief Complaint Chief Complaint  Patient presents with  . Fatigue  . Cough    HPI Christopher BerkshireRobert Buchanan is a 82 y.o. male.  HPI Patient presents to the emergency department with generalized weakness and fatigue and cough over the past week.  Daughter has been caring for him at home states over the past several months he has had some decline in his general strength.  He's also had more diarrhea over the past week and has had multiple accidents.  The daughter reports that this is becoming more difficult to care for at home and thinks he may need to be placed into a skilled nursing facility.  No significant increasing confusion.  As reported to me from the daughter that he has a pancreatic mass was diagnosed last year and I think this may be cancer but given his advanced age there not treating this aggressively.  He's had some weight loss but not significant weight loss.  He's had some mild anorexia.  His complaint of some nausea but has not vomited in the past several days.  Daughter reports decreased oral intake   Past Medical History:  Diagnosis Date  . Acute respiratory failure with hypoxia (HCC) 10/07/2012  . COPD exacerbation (HCC) 10/07/2012  . GERD (gastroesophageal reflux disease)   . Hematuria 10/08/2012  . HTN (hypertension) 10/07/2012  . Hypercapnia 10/08/2012  . Hyperkalemia 10/08/2012  . Hyponatremia 10/08/2012  . Pacemaker medtronic single chamber    Patient Active Problem List   Diagnosis Date Noted  . AV block, 3rd degree (HCC) 05/14/2015  . Absolute anemia 11/15/2014  . Fibromyalgia 11/15/2014  . Adult hypothyroidism 11/15/2014  . Elevated prostate specific antigen (PSA) 11/15/2014  . Hematuria 10/08/2012  . Hyponatremia 10/08/2012  . Hyperkalemia 10/08/2012  . Hypercapnia 10/08/2012  . Acute respiratory failure with hypoxia (HCC) 10/07/2012  . COPD exacerbation (HCC)  10/07/2012  . HTN (hypertension) 10/07/2012  . Pacemaker 10/07/2012  . Excessive falling 07/20/2012  . Abnormal gait 07/20/2012  . Coagulation disorder (HCC) 06/16/2012  . Long term current use of anticoagulant 06/16/2012  . Bronchitis 05/04/2012  . Chronic atrial fibrillation (HCC) 09/25/2011  . Polyneuropathy (HCC) 03/27/2011  . Acid reflux 02/18/2011  . Artificial cardiac pacemaker 05/30/2010  . Sick sinus syndrome (HCC) 05/30/2010    Past Surgical History:  Procedure Laterality Date  . PACEMAKER INSERTION         Home Medications    Prior to Admission medications   Medication Sig Start Date End Date Taking? Authorizing Provider  acetaminophen (TYLENOL) 500 MG tablet Take 1,000 mg by mouth every 8 (eight) hours as needed for moderate pain.    Historical Provider, MD  alendronate (FOSAMAX) 70 MG tablet Take 70 mg by mouth once a week. Take with a full glass of water on an empty stomach.    Historical Provider, MD  b complex vitamins tablet Take 1 tablet by mouth daily.    Historical Provider, MD  diphenhydrAMINE (BENADRYL) 25 MG tablet Take 50 mg by mouth once.    Historical Provider, MD  ELIQUIS 5 MG TABS tablet TAKE 1 TABLET TWICE DAILY 03/13/15   Lars MassonKatarina H [name], MD  lansoprazole (PREVACID) 30 MG capsule Take 30 mg by mouth daily.    Historical Provider, MD  levofloxacin (LEVAQUIN) 500 MG tablet Take 1 tablet (500 mg total) by mouth daily. 12/29/15   Azalia BilisKevin Irineo Gaulin,  MD  levothyroxine (SYNTHROID, LEVOTHROID) 200 MCG tablet Take 200 mcg by mouth daily before breakfast.    Historical Provider, MD  Multiple Vitamins-Minerals (MULTIVITAMIN WITH MINERALS) tablet Take 1 tablet by mouth daily.    Historical Provider, MD  NON FORMULARY Apply 1 application topically daily. 1:1 Refridgerated Sarna Lotion:Triamcinolone Cream    Historical Provider, MD  ondansetron (ZOFRAN) 4 MG tablet Take 4 mg by mouth every 6 (six) hours as needed. for nausea 10/30/14   Historical Provider, MD     Family History Family History  Problem Relation Age of Onset  . Stroke Father     Social History Social History  Substance Use Topics  . Smoking status: Never Smoker  . Smokeless tobacco: Never Used  . Alcohol use No     Allergies   Patient has no known allergies.   Review of Systems Review of Systems  All other systems reviewed and are negative.    Physical Exam Updated Vital Signs BP 107/76   Pulse (!) 59   Temp 97.5 F (36.4 C) (Oral)   Resp 22   Ht 5\' 11"  (1.803 m)   Wt 185 lb (83.9 kg)   SpO2 96%   BMI 25.80 kg/m   Physical Exam  Constitutional: He is oriented to person, place, and time. He appears well-developed and well-nourished.  HENT:  Head: Normocephalic and atraumatic.  Eyes: EOM are normal.  Neck: Normal range of motion.  Cardiovascular: Normal rate, regular rhythm, normal heart sounds and intact distal pulses.   Pulmonary/Chest: Effort normal and breath sounds normal. No respiratory distress.  Abdominal: Soft. He exhibits no distension. There is no tenderness.  Musculoskeletal: Normal range of motion.  Neurological: He is alert and oriented to person, place, and time.  Skin: Skin is warm and dry.  Psychiatric: He has a normal mood and affect. Judgment normal.  Nursing note and vitals reviewed.    ED Treatments / Results  Labs (all labs ordered are listed, but only abnormal results are displayed) Labs Reviewed  CBC - Abnormal; Notable for the following:       Result Value   WBC 13.2 (*)    All other components within normal limits  COMPREHENSIVE METABOLIC PANEL - Abnormal; Notable for the following:    Glucose, Bld 134 (*)    BUN 23 (*)    Creatinine, Ser 1.30 (*)    Albumin 2.6 (*)    Total Bilirubin 1.5 (*)    GFR calc non Af Amer 46 (*)    GFR calc Af Amer 53 (*)    All other components within normal limits  LIPASE, BLOOD    EKG  EKG Interpretation None       Radiology Dg Chest 2 View  Result Date:  12/28/2015 CLINICAL DATA:  Cough EXAM: CHEST  2 VIEW COMPARISON:  12/24/2014 FINDINGS: Subtle peripheral hazy density in the right chest with small effusion. This could reflect a pneumonia. No Kerley line. No pneumothorax. Chronic cardiomegaly and aortic tortuosity. Single chamber pacer from the left. Chronic malunited right proximal humerus fracture. Chronic L1 and L2 compression fractures with presumed vertebroplasty at L1. Remote left posterior rib fractures. IMPRESSION: Possible pneumonia in the peripheral right lung. Small right effusion. Electronically Signed   By: Marnee SpringJonathon  Watts M.D.   On: 12/28/2015 12:31    Procedures Procedures (including critical care time)  Medications Ordered in ED Medications  sodium chloride 0.9 % bolus 1,000 mL (0 mLs Intravenous Stopped 12/28/15 1358)  ondansetron (ZOFRAN) injection  4 mg (4 mg Intravenous Given 12/28/15 1206)  levofloxacin (LEVAQUIN) tablet 500 mg (500 mg Oral Given 12/28/15 1446)     Initial Impression / Assessment and Plan / ED Course  I have reviewed the triage vital signs and the nursing notes.  Pertinent labs & imaging results that were available during my care of the patient were reviewed by me and considered in my medical decision making (see chart for details).  Clinical Course   Surprisingly well appearing compared to what was initially described to me by the daughter prior to my evaluation.  He has no significant complaints at this time.  There is a questionable pneumonia for which were treated with antibiotics.  He feels better after IV fluids.  His labs are without significant abnormality.  I spoke with my case management team who will help arrange home health resources to assist the patient's daughter.  Patient be discharged in the emergency department this time.  Final Clinical Impressions(s) / ED Diagnoses   Final diagnoses:  Aspiration pneumonia, unspecified aspiration pneumonia type, unspecified laterality, unspecified  part of lung (HCC)  Weakness    New Prescriptions New Prescriptions   LEVOFLOXACIN (LEVAQUIN) 500 MG TABLET    Take 1 tablet (500 mg total) by mouth daily.     Azalia Bilis, MD 12/28/15 1455

## 2015-12-28 NOTE — ED Notes (Signed)
Pt back in room.  Placed urinal between pts legs.  He advised he will try to give sample.

## 2015-12-28 NOTE — ED Notes (Signed)
Condom cath placed on pt 

## 2015-12-28 NOTE — ED Notes (Signed)
Pt and pt's family verbalized understanding of d/c instructions and has no further questions. Pt sent home with prescription for levaquin and home health is to see patient. VSS. NAD.

## 2015-12-28 NOTE — ED Triage Notes (Signed)
Per Pt's Family, Pt started to become fatigued and have cough and congestion starting a week ago. Pt was seen by PCP and given Z-pack on Wednesday. Family reports that patient has gotten worse. Pt denies pain, but reports "feeling awful." Family reports patient has become incontinent over the last week, refuses to get out of bed, and will not eat anything. Family is also requesting a Child psychotherapistsocial worker to set up with potential help for patient's care.

## 2016-01-05 ENCOUNTER — Other Ambulatory Visit: Payer: Self-pay | Admitting: Cardiology

## 2016-02-07 DEATH — deceased

## 2016-10-25 IMAGING — CR DG CHEST 2V
2 series · 2 of 2 positions shown · non-contrast
Comparison: 12/26/2013 and 11/02/2012

CLINICAL DATA: Chest pain.  Nausea and vomiting.

EXAM:
CHEST  2 VIEW

[chest pa]
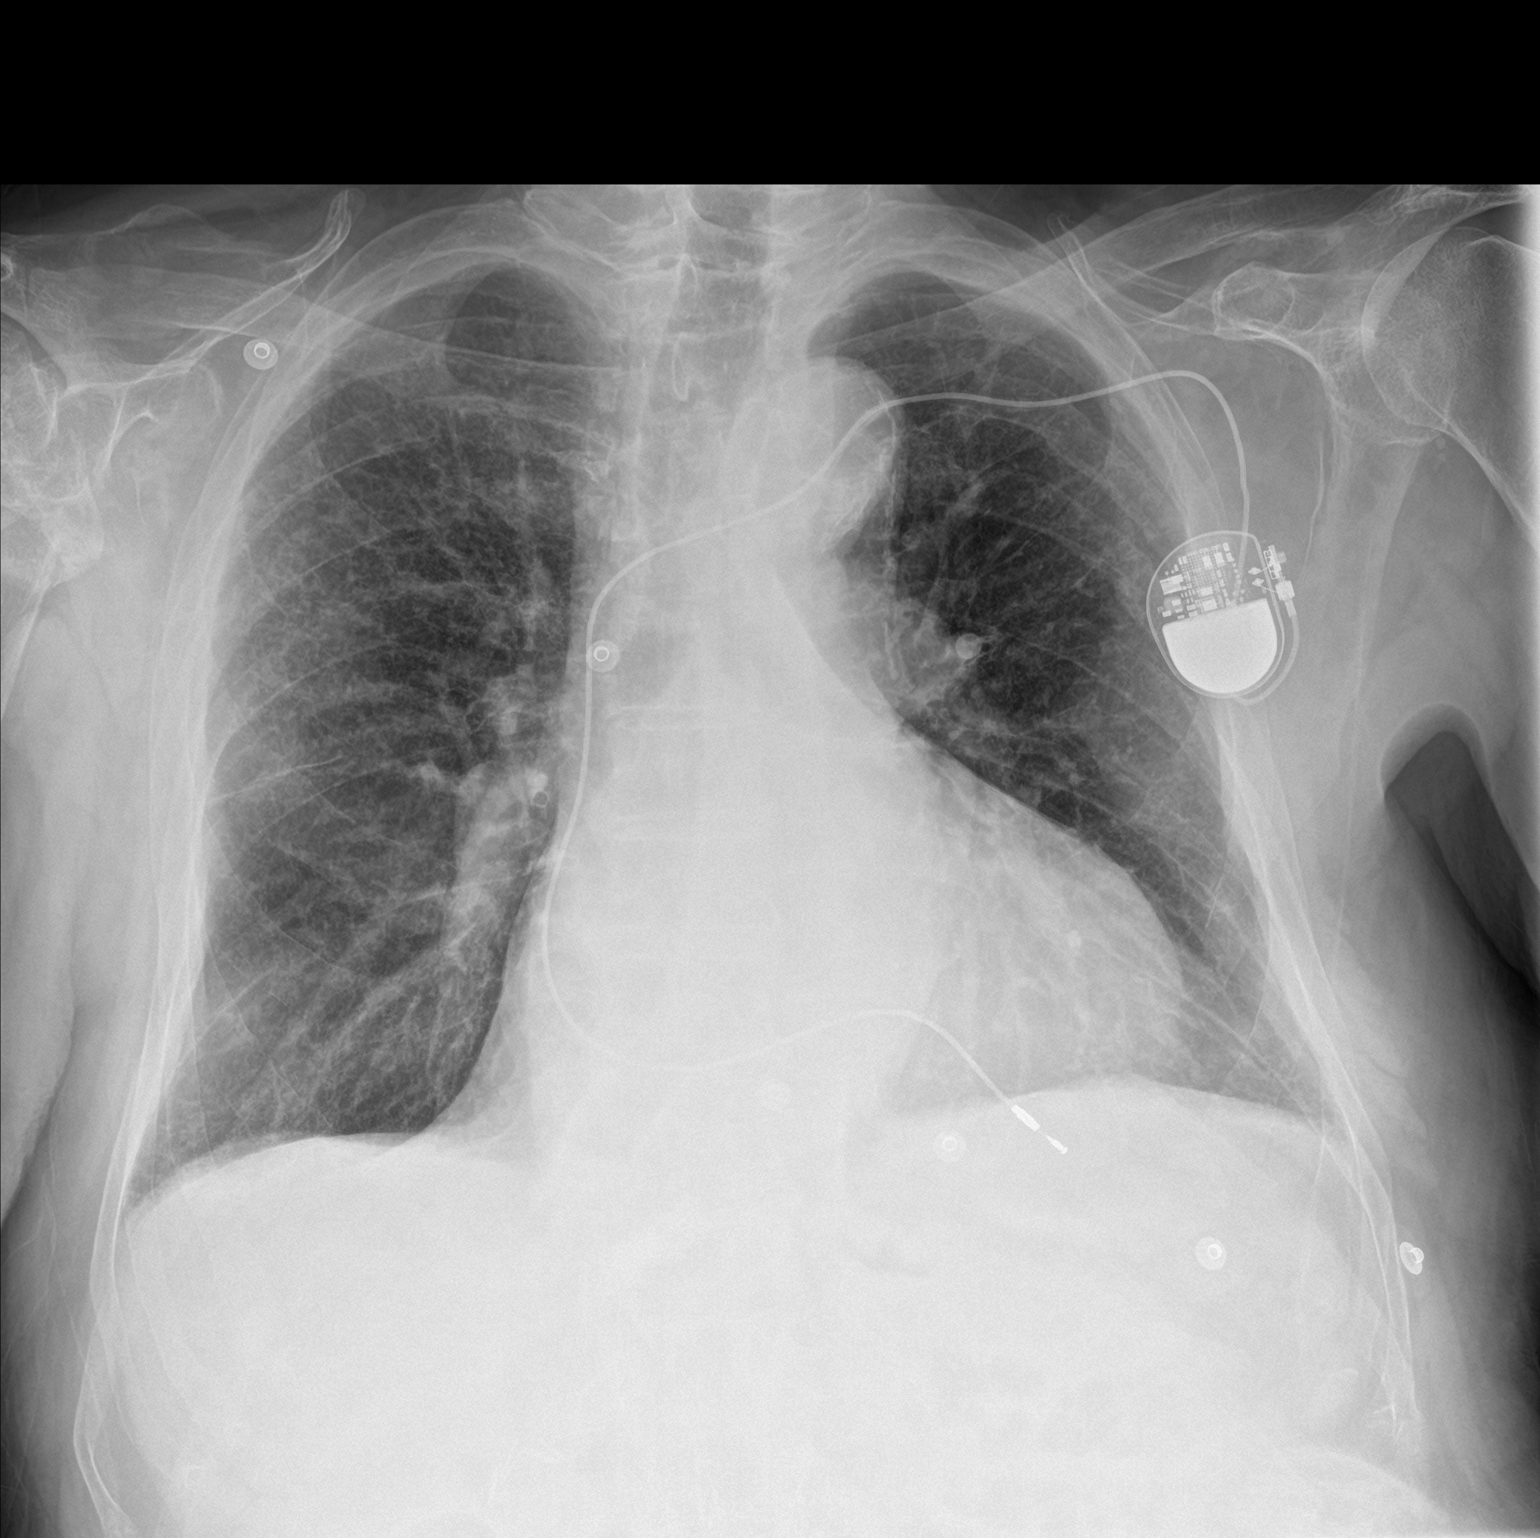

[chest lat]
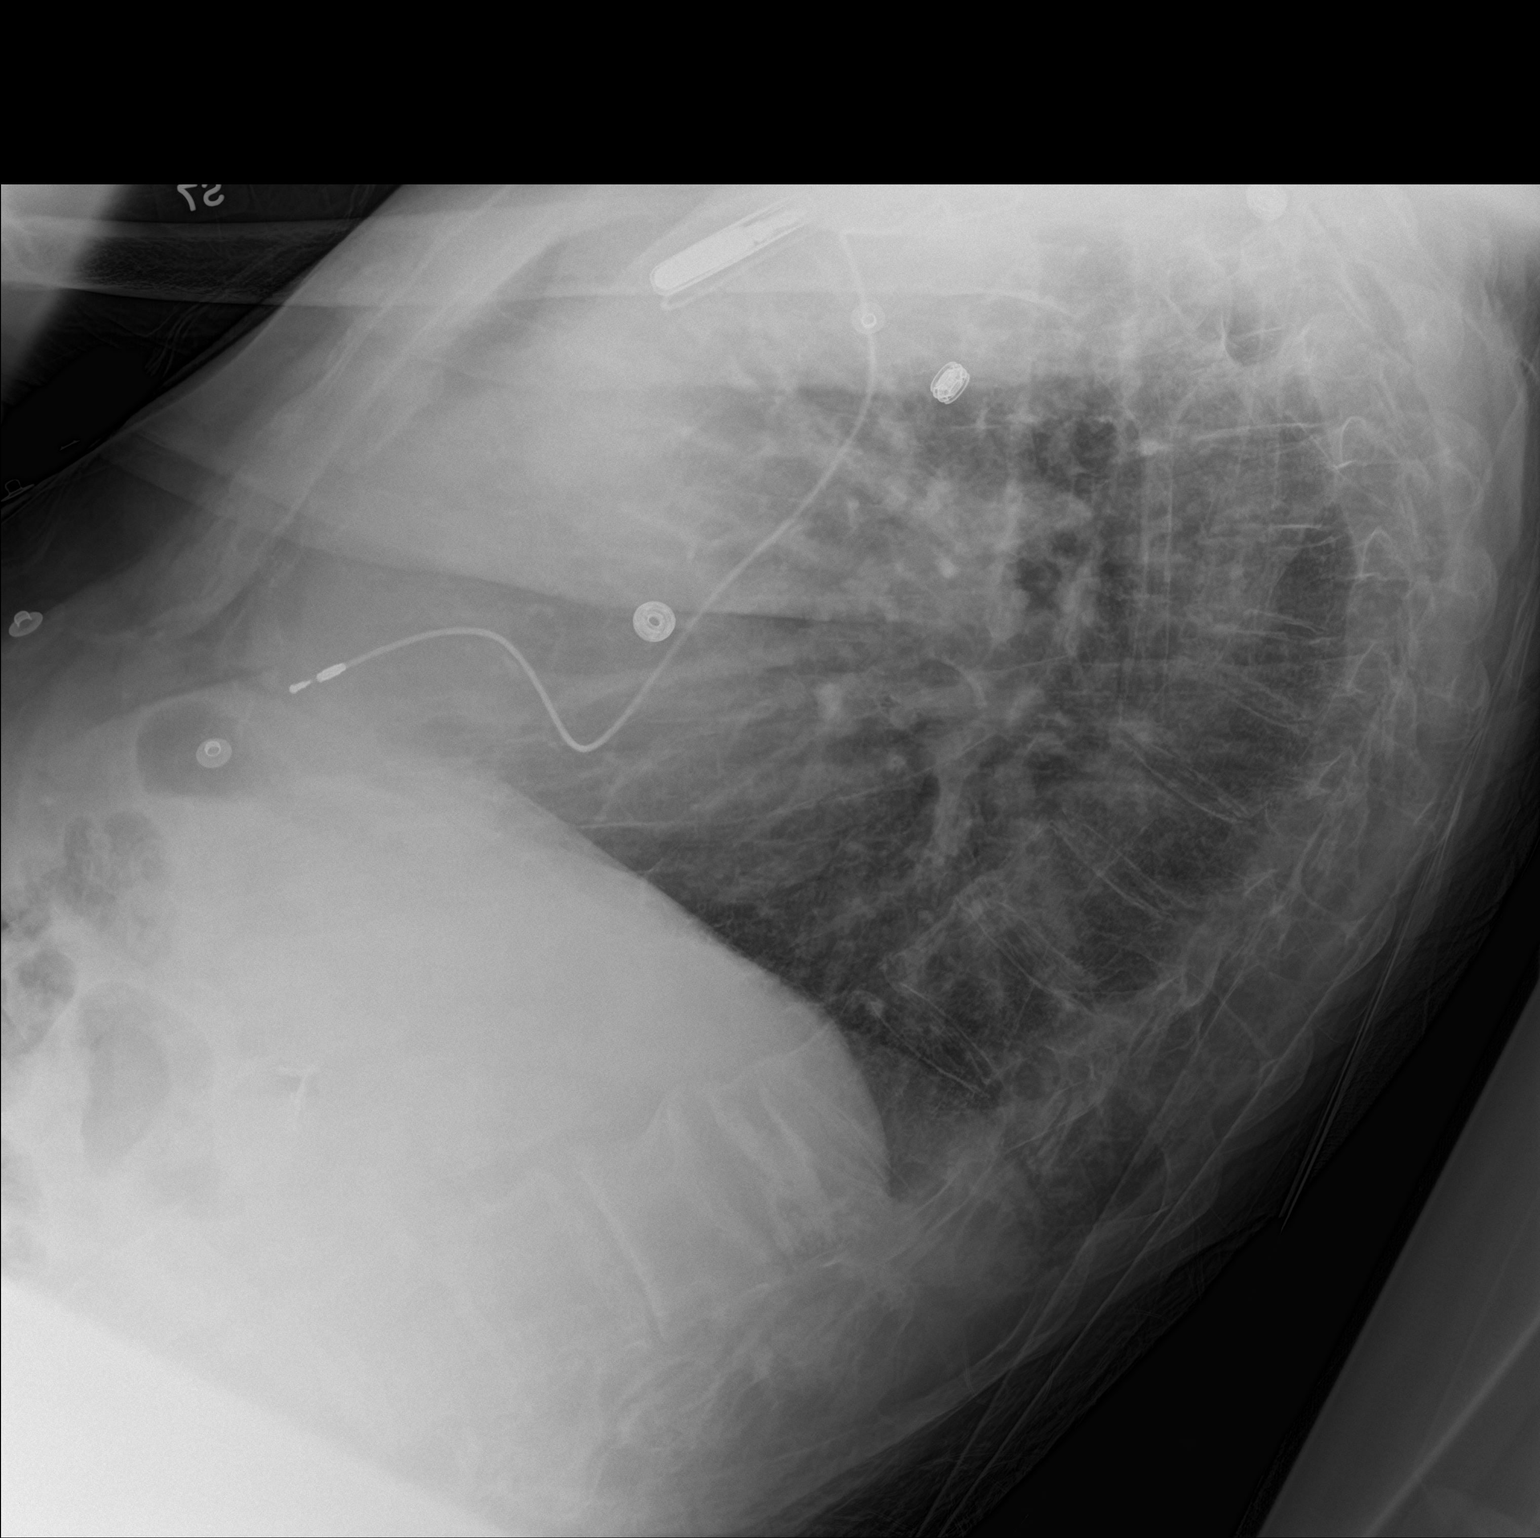

[2 of 2 positions shown; findings below may reference images not displayed]

FINDINGS: There is borderline cardiomegaly. Pulmonary vascularity is at the
upper limits of normal. Patient has chronic interstitial and
obstructive lung disease, but the interstitial accentuation has
progressed on the right. No infiltrates or effusions. Tortuosity and
calcification of the thoracic aorta. Old compression fractures in
the mid and lower thoracic spine.

Pacemaker in place.
IMPRESSION: No acute abnormality.  Progressive chronic lung disease.

## 2017-07-24 ENCOUNTER — Encounter: Payer: Self-pay | Admitting: Cardiology
